# Patient Record
Sex: Female | Born: 1990 | Race: Black or African American | Hispanic: No | Marital: Single | Smoking: Never smoker
Health system: Southern US, Community
[De-identification: ages and names within clinical notes are randomized; demographics above are authoritative.]

## PROBLEM LIST (undated history)

## (undated) ENCOUNTER — Emergency Department (HOSPITAL_COMMUNITY): Admission: EM | Payer: Self-pay | Source: Home / Self Care

## (undated) DIAGNOSIS — N946 Dysmenorrhea, unspecified: Secondary | ICD-10-CM

## (undated) DIAGNOSIS — R7989 Other specified abnormal findings of blood chemistry: Secondary | ICD-10-CM

## (undated) DIAGNOSIS — G43009 Migraine without aura, not intractable, without status migrainosus: Secondary | ICD-10-CM

## (undated) HISTORY — PX: WISDOM TOOTH EXTRACTION: SHX21

## (undated) HISTORY — DX: Other specified abnormal findings of blood chemistry: R79.89

## (undated) HISTORY — DX: Dysmenorrhea, unspecified: N94.6

## (undated) HISTORY — PX: CYST REMOVAL HAND: SHX6279

## (undated) HISTORY — DX: Migraine without aura, not intractable, without status migrainosus: G43.009

---
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
## 2017-07-13 DIAGNOSIS — M67431 Ganglion, right wrist: Secondary | ICD-10-CM | POA: Diagnosis not present

## 2017-07-13 DIAGNOSIS — Z01812 Encounter for preprocedural laboratory examination: Secondary | ICD-10-CM | POA: Diagnosis not present

## 2017-07-20 DIAGNOSIS — H52223 Regular astigmatism, bilateral: Secondary | ICD-10-CM | POA: Diagnosis not present

## 2017-07-27 DIAGNOSIS — Z113 Encounter for screening for infections with a predominantly sexual mode of transmission: Secondary | ICD-10-CM | POA: Diagnosis not present

## 2017-07-27 DIAGNOSIS — R100 Acute abdomen: Secondary | ICD-10-CM | POA: Diagnosis not present

## 2017-07-31 DIAGNOSIS — M67431 Ganglion, right wrist: Secondary | ICD-10-CM | POA: Diagnosis not present

## 2017-09-10 DIAGNOSIS — N760 Acute vaginitis: Secondary | ICD-10-CM | POA: Diagnosis not present

## 2017-09-10 DIAGNOSIS — Z113 Encounter for screening for infections with a predominantly sexual mode of transmission: Secondary | ICD-10-CM | POA: Diagnosis not present

## 2017-09-19 DIAGNOSIS — Z01419 Encounter for gynecological examination (general) (routine) without abnormal findings: Secondary | ICD-10-CM | POA: Diagnosis not present

## 2017-09-19 DIAGNOSIS — Z30019 Encounter for initial prescription of contraceptives, unspecified: Secondary | ICD-10-CM | POA: Diagnosis not present

## 2017-09-19 DIAGNOSIS — Z113 Encounter for screening for infections with a predominantly sexual mode of transmission: Secondary | ICD-10-CM | POA: Diagnosis not present

## 2017-12-28 DIAGNOSIS — R112 Nausea with vomiting, unspecified: Secondary | ICD-10-CM | POA: Diagnosis not present

## 2017-12-28 DIAGNOSIS — M545 Low back pain: Secondary | ICD-10-CM | POA: Diagnosis not present

## 2017-12-28 DIAGNOSIS — D219 Benign neoplasm of connective and other soft tissue, unspecified: Secondary | ICD-10-CM | POA: Diagnosis not present

## 2017-12-28 DIAGNOSIS — R1032 Left lower quadrant pain: Secondary | ICD-10-CM | POA: Diagnosis not present

## 2017-12-28 DIAGNOSIS — N83202 Unspecified ovarian cyst, left side: Secondary | ICD-10-CM | POA: Diagnosis not present

## 2017-12-28 DIAGNOSIS — R42 Dizziness and giddiness: Secondary | ICD-10-CM | POA: Diagnosis not present

## 2017-12-28 DIAGNOSIS — D259 Leiomyoma of uterus, unspecified: Secondary | ICD-10-CM | POA: Diagnosis not present

## 2018-01-01 DIAGNOSIS — R102 Pelvic and perineal pain: Secondary | ICD-10-CM | POA: Diagnosis not present

## 2018-01-01 DIAGNOSIS — N940 Mittelschmerz: Secondary | ICD-10-CM | POA: Diagnosis not present

## 2018-04-02 DIAGNOSIS — B379 Candidiasis, unspecified: Secondary | ICD-10-CM | POA: Diagnosis not present

## 2018-04-02 DIAGNOSIS — L739 Follicular disorder, unspecified: Secondary | ICD-10-CM | POA: Diagnosis not present

## 2018-04-02 DIAGNOSIS — N898 Other specified noninflammatory disorders of vagina: Secondary | ICD-10-CM | POA: Diagnosis not present

## 2018-04-02 DIAGNOSIS — Z6829 Body mass index (BMI) 29.0-29.9, adult: Secondary | ICD-10-CM | POA: Diagnosis not present

## 2018-09-27 ENCOUNTER — Encounter (HOSPITAL_BASED_OUTPATIENT_CLINIC_OR_DEPARTMENT_OTHER): Payer: Self-pay | Admitting: Adult Health

## 2018-09-27 ENCOUNTER — Emergency Department (HOSPITAL_BASED_OUTPATIENT_CLINIC_OR_DEPARTMENT_OTHER)
Admission: EM | Admit: 2018-09-27 | Discharge: 2018-09-28 | Disposition: A | Discharge status: Home / Self Care | Payer: BLUE CROSS/BLUE SHIELD | Attending: Emergency Medicine | Admitting: Emergency Medicine

## 2018-09-27 ENCOUNTER — Other Ambulatory Visit: Payer: Self-pay

## 2018-09-27 DIAGNOSIS — T39395A Adverse effect of other nonsteroidal anti-inflammatory drugs [NSAID], initial encounter: Secondary | ICD-10-CM | POA: Diagnosis not present

## 2018-09-27 DIAGNOSIS — T783XXA Angioneurotic edema, initial encounter: Secondary | ICD-10-CM | POA: Insufficient documentation

## 2018-09-27 DIAGNOSIS — T7840XA Allergy, unspecified, initial encounter: Secondary | ICD-10-CM

## 2018-09-27 DIAGNOSIS — R220 Localized swelling, mass and lump, head: Secondary | ICD-10-CM | POA: Diagnosis present

## 2018-09-27 MED ORDER — FAMOTIDINE IN NACL 20-0.9 MG/50ML-% IV SOLN
20.0000 mg | Freq: Once | INTRAVENOUS | Status: AC
Start: 2018-09-27 — End: 2018-09-27
  Administered 2018-09-27: 20 mg via INTRAVENOUS
  Filled 2018-09-27: qty 50

## 2018-09-27 MED ORDER — PREDNISONE 10 MG (21) PO TBPK: ORAL_TABLET | Freq: Every day | ORAL | 0 refills | Status: DC

## 2018-09-27 MED ORDER — FAMOTIDINE 20 MG PO TABS: 20.0000 mg | ORAL_TABLET | Freq: Two times a day (BID) | ORAL | 0 refills | Status: DC

## 2018-09-27 MED ORDER — EPINEPHRINE 0.3 MG/0.3ML IJ SOAJ
0.3000 mg | Freq: Once | INTRAMUSCULAR | Status: AC
  Administered 2018-09-27: 0.3 mg via INTRAMUSCULAR
  Filled 2018-09-27: qty 0.3

## 2018-09-27 MED ORDER — METHYLPREDNISOLONE SODIUM SUCC 125 MG IJ SOLR
125.0000 mg | Freq: Once | INTRAMUSCULAR | Status: AC
  Administered 2018-09-27: 125 mg via INTRAVENOUS
  Filled 2018-09-27: qty 2

## 2018-09-27 MED ORDER — EPINEPHRINE 0.3 MG/0.3ML IJ SOAJ: 0.3000 mg | Freq: Once | INTRAMUSCULAR | 0 refills | Status: AC

## 2018-09-27 NOTE — ED Triage Notes (Addendum)
Presents with facial swelling, lip swelling, urticaria and wheezing feeling after taking Ibuprofen. PT has same allergic response to ASA.

## 2018-09-27 NOTE — ED Notes (Signed)
Hives on abd have decreased in size and pt reports itching sensation has subsided. Pt eye lids upper and lower remain swollen. Pt is able to open and close her eyes. Pt denies dysnea, sob, or wheezing at this time.

## 2018-09-27 NOTE — Discharge Instructions (Addendum)
Take steroids beginning tomorrow.  You can take Pepcid and Benadryl as needed for itching. If you need to use your EpiPen, call 911 immediately afterwards.

## 2018-09-27 NOTE — ED Provider Notes (Signed)
[facility] EMERGENCY DEPARTMENT Provider Note   CSN: [id number] Arrival date & time: 09/27/18  2200     History   Chief Complaint Chief Complaint  Patient presents with  . Allergic Reaction    HPI Lynn Brooks is a 27 y.o. female who presents to ED for allergic reaction.  States that she took a dose of ibuprofen about 30 minutes prior to arrival.  She began having swelling around her eyes, feeling like her tongue is itchy as well as diffuse hives.  She said history of similar symptoms when she took aspirin in the past.  However, she does state that she is taking ibuprofen in the past with none of the symptoms.  She cannot recall any other inciting factor that may have triggered the symptoms.  She has not use an EpiPen in the past.  Denies any chest tightness but does not endorse wheezing.  Denies sensation of throat closing up, lip swelling or tongue swelling.  No known cardiac history.  HPI  History reviewed. No pertinent past medical history.  There are no active problems to display for this patient.      OB History   None      Home Medications    Prior to Admission medications   Medication Sig Start Date End Date Taking? Authorizing Provider  EPINEPHrine 0.3 mg/0.3 mL IJ SOAJ injection Inject 0.3 mLs (0.3 mg total) into the muscle once for 1 dose. 09/27/18 09/27/18  Trenton Verne, PA-C  famotidine (PEPCID) 20 MG tablet Take 1 tablet (20 mg total) by mouth 2 (two) times daily. 09/27/18   Ertha Nabor, PA-C  predniSONE (STERAPRED UNI-PAK 21 TAB) 10 MG (21) TBPK tablet Take by mouth daily. Take 6 tabs by mouth daily  for 2 days, then 5 tabs for 2 days, then 4 tabs for 2 days, then 3 tabs for 2 days, 2 tabs for 2 days, then 1 tab by mouth daily for 2 days 09/27/18   Dietrich Pates, PA-C    Family History History reviewed. No pertinent family history.  Social History Social History   Tobacco Use  . Smoking status: Not on file  Substance Use Topics  . Alcohol use:  Not on file  . Drug use: Not on file     Allergies   Aspirin   Review of Systems Review of Systems  Constitutional: Negative for appetite change, chills and fever.  HENT: Positive for facial swelling. Negative for ear pain, rhinorrhea, sneezing and sore throat.   Eyes: Negative for photophobia and visual disturbance.  Respiratory: Negative for cough, chest tightness, shortness of breath and wheezing.   Cardiovascular: Negative for chest pain and palpitations.  Gastrointestinal: Negative for abdominal pain, blood in stool, constipation, diarrhea, nausea and vomiting.  Genitourinary: Negative for dysuria, hematuria and urgency.  Musculoskeletal: Negative for myalgias.  Skin: Positive for rash.  Neurological: Negative for dizziness, weakness and light-headedness.     Physical Exam Updated Vital Signs BP 117/69 (BP Location: Right Arm)   Pulse 70   Temp 98.1 F (36.7 C) (Oral)   Resp 18   Ht 5' (1.524 m)   Wt 70.3 kg   LMP 09/20/2018 (Exact Date)   SpO2 94%   BMI 30.27 kg/m   Physical Exam  Constitutional: She appears well-developed and well-nourished. No distress.  Normal work of breathing noted.  No lip swelling or tongue swelling noted.  Normal voice.  HENT:  Head: Normocephalic and atraumatic.  Nose: Nose normal.  Eyes: Conjunctivae and  EOM are normal. Right eye exhibits no discharge. Left eye exhibits no discharge. No scleral icterus.  Bilateral periorbital edema.  EOMs intact.  Neck: Normal range of motion. Neck supple.  Cardiovascular: Normal rate, regular rhythm, normal heart sounds and intact distal pulses. Exam reveals no gallop and no friction rub.  No murmur heard. Pulmonary/Chest: Effort normal and breath sounds normal. No respiratory distress.  Abdominal: Soft. Bowel sounds are normal. She exhibits no distension. There is no tenderness. There is no guarding.  Musculoskeletal: Normal range of motion. She exhibits no edema.  Neurological: She is alert. She  exhibits normal muscle tone. Coordination normal.  Skin: Skin is warm and dry. Rash noted.  Diffuse urticarial rash to torso.  Psychiatric: She has a normal mood and affect.  Nursing note and vitals reviewed.    ED Treatments / Results  Labs (all labs ordered are listed, but only abnormal results are displayed) Labs Reviewed - No data to display  EKG None  Radiology No results found.  Procedures Procedures (including critical care time)  CRITICAL CARE Performed by: Dietrich Pates   Total critical care time: 45 minutes  Critical care time was exclusive of separately billable procedures and treating other patients.  Critical care was necessary to treat or prevent imminent or life-threatening deterioration.  Critical care was time spent personally by me on the following activities: development of treatment plan with patient and/or surrogate as well as nursing, discussions with consultants, evaluation of patient's response to treatment, examination of patient, obtaining history from patient or surrogate, ordering and performing treatments and interventions, ordering and review of laboratory studies, ordering and review of radiographic studies, pulse oximetry and re-evaluation of patient's condition.   Medications Ordered in ED Medications  methylPREDNISolone sodium succinate (SOLU-MEDROL) 125 mg/2 mL injection 125 mg (125 mg Intravenous Given 09/27/18 2216)  famotidine (PEPCID) IVPB 20 mg premix (0 mg Intravenous Stopped 09/27/18 2252)  EPINEPHrine (EPI-PEN) injection 0.3 mg (0.3 mg Intramuscular Given 09/27/18 2216)     Initial Impression / Assessment and Plan / ED Course  I have reviewed the triage vital signs and the nursing notes.  Pertinent labs & imaging results that were available during my care of the patient were reviewed by me and considered in my medical decision making (see chart for details).     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  She took ibuprofen prior to arrival.  She is taking ibuprofen in the past with no symptoms.  However, she reports similar symptoms when taking aspirin.  She is taking it for a headache.  She reports swelling around bilateral eyes, sensation of itchy tongue.  Denies any sensation of throat closing or chest tightness.  She took 2 Benadryl prior to arrival.  On exam there is bilateral periorbital swelling noted.  No signs of respiratory distress, airway compromise or voice changes noted.  She was given a dose of epinephrine, Solu-Medrol and Pepcid.  She will need to be observed for appropriate amount of time after medications.   Portions of this note were generated with Scientist, clinical (histocompatibility and immunogenetics). Dictation errors may occur despite best attempts at proofreading.  Final Clinical Impressions(s) / ED Diagnoses   Final diagnoses:  Angioedema, initial encounter  Allergic reaction to drug, initial encounter    ED Discharge Orders         Ordered    predniSONE (STERAPRED UNI-PAK 21 TAB) 10 MG (21) TBPK tablet  Daily  09/27/18 2305    EPINEPHrine 0.3 mg/0.3 mL IJ SOAJ injection   Once     09/27/18 2305    famotidine (PEPCID) 20 MG tablet  2 times daily     09/27/18 2305           Dietrich Pates, PA-C 09/27/18 2308    Benjiman Core, MD 09/27/18 2328

## 2018-09-28 MED ORDER — HYDROCODONE-ACETAMINOPHEN 5-325 MG PO TABS
1.0000 | ORAL_TABLET | Freq: Once | ORAL | Status: AC
  Administered 2018-09-28: 1 via ORAL
  Filled 2018-09-28: qty 1

## 2018-11-14 DIAGNOSIS — L501 Idiopathic urticaria: Secondary | ICD-10-CM | POA: Diagnosis not present

## 2018-11-26 ENCOUNTER — Other Ambulatory Visit: Payer: Self-pay

## 2018-11-26 ENCOUNTER — Encounter: Payer: Self-pay | Admitting: Obstetrics and Gynecology

## 2018-11-26 ENCOUNTER — Ambulatory Visit: Payer: BLUE CROSS/BLUE SHIELD | Admitting: Obstetrics and Gynecology

## 2018-11-26 VITALS — BP 118/86 | HR 71 | Wt 155.8 lb

## 2018-11-26 DIAGNOSIS — B9689 Other specified bacterial agents as the cause of diseases classified elsewhere: Secondary | ICD-10-CM | POA: Diagnosis not present

## 2018-11-26 DIAGNOSIS — N760 Acute vaginitis: Secondary | ICD-10-CM

## 2018-11-26 MED ORDER — METRONIDAZOLE 500 MG PO TABS: 500.0000 mg | ORAL_TABLET | Freq: Two times a day (BID) | ORAL | 0 refills | Status: DC

## 2018-11-26 NOTE — Progress Notes (Signed)
28 y.o. G0P0000 Single Black or African American Not Hispanic or Latino female here for vaginal discharge and odor. The d/c started a month ago, watery, white, frothy with an odor.  Occasionally has itching. No urinary c/o. Sexually active, same partner x 8 years.    Period Cycle (Days): 28 Period Duration (Days): 5-7 days Period Pattern: Regular Menstrual Flow: Heavy, Light Menstrual Control: Tampon Menstrual Control Change Freq (Hours): changes tampon every 2 hours Dysmenorrhea: (!) Severe Dysmenorrhea Symptoms: Cramping  Patient's last menstrual period was 11/15/2018 (exact date).          Sexually active: Yes.    The current method of family planning is none.    Exercising: Yes.    cardio, weights Smoker:  no  Health Maintenance: Pap:  10/2018 WNL per patient. History of abnormal Pap:  no TDaP:  Unsure Gardasil: completed all 3 per patient   reports that she has never smoked. She has never used smokeless tobacco. She reports current alcohol use. She reports that she does not use drugs.  Past Medical History:  Diagnosis Date  . Dysmenorrhea     Past Surgical History:  Procedure Laterality Date  . CYST REMOVAL HAND    . WISDOM TOOTH EXTRACTION      Current Outpatient Medications  Medication Sig Dispense Refill  . cetirizine (ZYRTEC) 10 MG tablet Take 2 tablets by mouth daily.     No current facility-administered medications for this visit.     Family History  Problem Relation Age of Onset  . Breast cancer Paternal Aunt     Review of Systems  Constitutional: Negative.   HENT: Negative.   Eyes: Negative.   Respiratory: Negative.   Cardiovascular: Negative.   Gastrointestinal: Negative.   Endocrine: Negative.   Genitourinary: Positive for vaginal discharge.       Vaginal odor  Musculoskeletal: Negative.   Skin: Negative.   Allergic/Immunologic: Negative.   Neurological: Negative.   Hematological: Negative.   Psychiatric/Behavioral: Negative.     Exam:    BP 118/86 (BP Location: Right Arm, Patient Position: Sitting, Cuff Size: Normal)   Pulse 71   Wt 155 lb 12.8 oz (70.7 kg)   LMP 11/15/2018 (Exact Date)   BMI 30.43 kg/m   Weight change: @WEIGHTCHANGE @ Height:      Ht Readings from Last 3 Encounters:  09/27/18 5' (1.524 m)    General appearance: alert, cooperative and appears stated age   Pelvic: External genitalia:  no lesions              Urethra:  normal appearing urethra with no masses, tenderness or lesions              Bartholins and Skenes: normal                 Vagina: normal appearing vagina with slight erythema, some increase in watery/creamy white vaginal d/c, frothy, no lesions              Cervix: no lesions                Chaperone was present for exam.  Wet prep: ++ clue, no trich, rare wbc KOH: no yeast PH: 5   A:  Bacterial vaginitis  P:   Treat with flagyl, no ETOH  Discussed condoms to try and prevent recurrence

## 2018-11-26 NOTE — Patient Instructions (Signed)
Vaginitis  Vaginitis is a condition in which the vaginal tissue swells and becomes red (inflamed). This condition is most often caused by a change in the normal balance of bacteria and yeast that live in the vagina. This change causes an overgrowth of certain bacteria or yeast, which causes the inflammation. There are different types of vaginitis, but the most common types are:   Bacterial vaginosis.   Yeast infection (candidiasis).   Trichomoniasis vaginitis. This is a sexually transmitted disease (STD).   Viral vaginitis.   Atrophic vaginitis.   Allergic vaginitis.  What are the causes?  The cause of this condition depends on the type of vaginitis. It can be caused by:   Bacteria (bacterial vaginosis).   Yeast, which is a fungus (yeast infection).   A parasite (trichomoniasis vaginitis).   A virus (viral vaginitis).   Low hormone levels (atrophic vaginitis). Low hormone levels can occur during pregnancy, breastfeeding, or after menopause.   Irritants, such as bubble baths, scented tampons, and feminine sprays (allergic vaginitis).  Other factors can change the normal balance of the yeast and bacteria that live in the vagina. These include:   Antibiotic medicines.   Poor hygiene.   Diaphragms, vaginal sponges, spermicides, birth control pills, and intrauterine devices (IUD).   Sex.   Infection.   Uncontrolled diabetes.   A weakened defense (immune) system.  What increases the risk?  This condition is more likely to develop in women who:   Smoke.   Use vaginal douches, scented tampons, or scented sanitary pads.   Wear tight-fitting pants.   Wear thong underwear.   Use oral birth control pills or an IUD.   Have sex without a condom.   Have multiple sex partners.   Have an STD.   Frequently use the spermicide nonoxynol-9.   Eat lots of foods high in sugar.   Have uncontrolled diabetes.   Have low estrogen levels.   Have a weakened immune system from an immune disorder or medical  treatment.   Are pregnant or breastfeeding.  What are the signs or symptoms?  Symptoms vary depending on the cause of the vaginitis. Common symptoms include:   Abnormal vaginal discharge.  ? The discharge is white, gray, or yellow with bacterial vaginosis.  ? The discharge is thick, white, and cheesy with a yeast infection.  ? The discharge is frothy and yellow or greenish with trichomoniasis.   A bad vaginal smell. The smell is fishy with bacterial vaginosis.   Vaginal itching, pain, or swelling.   Sex that is painful.   Pain or burning when urinating.  Sometimes there are no symptoms.  How is this diagnosed?  This condition is diagnosed based on your symptoms and medical history. A physical exam, including a pelvic exam, will also be done. You may also have other tests, including:   Tests to determine the pH level (acidity or alkalinity) of your vagina.   A whiff test, to assess the odor that results when a sample of your vaginal discharge is mixed with a potassium hydroxide solution.   Tests of vaginal fluid. A sample will be examined under a microscope.  How is this treated?  Treatment varies depending on the type of vaginitis you have. Your treatment may include:   Antibiotic creams or pills to treat bacterial vaginosis and trichomoniasis.   Antifungal medicines, such as vaginal creams or suppositories, to treat a yeast infection.   Medicine to ease discomfort if you have viral vaginitis. Your sexual partner   should also be treated.   Estrogen delivered in a cream, pill, suppository, or vaginal ring to treat atrophic vaginitis. If vaginal dryness occurs, lubricants and moisturizing creams may help. You may need to avoid scented soaps, sprays, or douches.   Stopping use of a product that is causing allergic vaginitis. Then using a vaginal cream to treat the symptoms.  Follow these instructions at home:  Lifestyle   Keep your genital area clean and dry. Avoid soap, and only rinse the area with  water.   Do not douche or use tampons until your health care provider says it is okay to do so. Use sanitary pads, if needed.   Do not have sex until your health care provider approves. When you can return to sex, practice safe sex and use condoms.   Wipe from front to back. This avoids the spread of bacteria from the rectum to the vagina.  General instructions   Take over-the-counter and prescription medicines only as told by your health care provider.   If you were prescribed an antibiotic medicine, take or use it as told by your health care provider. Do not stop taking or using the antibiotic even if you start to feel better.   Keep all follow-up visits as told by your health care provider. This is important.  How is this prevented?   Use mild, non-scented products. Do not use things that can irritate the vagina, such as fabric softeners. Avoid the following products if they are scented:  ? Feminine sprays.  ? Detergents.  ? Tampons.  ? Feminine hygiene products.  ? Soaps or bubble baths.   Let air reach your genital area.  ? Wear cotton underwear to reduce moisture buildup.  ? Avoid wearing underwear while you sleep.  ? Avoid wearing tight pants and underwear or nylons without a cotton panel.  ? Avoid wearing thong underwear.   Take off any wet clothing, such as bathing suits, as soon as possible.   Practice safe sex and use condoms.  Contact a health care provider if:   You have abdominal pain.   You have a fever.   You have symptoms that last for more than 2-3 days.  Get help right away if:   You have a fever and your symptoms suddenly get worse.  Summary   Vaginitis is a condition in which the vaginal tissue becomes inflamed.This condition is most often caused by a change in the normal balance of bacteria and yeast that live in the vagina.   Treatment varies depending on the type of vaginitis you have.   Do not douche, use tampons , or have sex until your health care provider approves. When  you can return to sex, practice safe sex and use condoms.  This information is not intended to replace advice given to you by your health care provider. Make sure you discuss any questions you have with your health care provider.  Document Released: 09/03/2007 Document Revised: 12/12/2016 Document Reviewed: 12/12/2016  Elsevier Interactive Patient Education  2019 [employer]

## 2018-12-02 ENCOUNTER — Telehealth: Payer: Self-pay | Admitting: Obstetrics and Gynecology

## 2018-12-02 NOTE — Telephone Encounter (Signed)
I apologize. Patient was advised to abstain for 7 days until she is completely finished with the entire course of Flagyl.

## 2018-12-02 NOTE — Telephone Encounter (Signed)
She needs to take the Flagyl for 7 days.  She has 14 tablets.  Abstain until all 14 tablets are taken.

## 2018-12-02 NOTE — Telephone Encounter (Signed)
Patient wanting to know how long should she wait after taking medication to have intercourse.

## 2018-12-02 NOTE — Telephone Encounter (Signed)
Spoke with patient. Patient is taking oral Flagyl 500 mg po BID x 5 days for BV. Asking how long she needs to wait before having sex. Reccommended that patient abstain from intercourse until after completion of medication and to use condoms to try to prevent recurrence. Patient verbalizes understanding.   Routing to covering provider and will close encounter.

## 2018-12-20 ENCOUNTER — Telehealth: Payer: Self-pay | Admitting: Obstetrics and Gynecology

## 2018-12-20 ENCOUNTER — Ambulatory Visit (INDEPENDENT_AMBULATORY_CARE_PROVIDER_SITE_OTHER): Payer: BLUE CROSS/BLUE SHIELD | Admitting: Obstetrics and Gynecology

## 2018-12-20 ENCOUNTER — Encounter: Payer: Self-pay | Admitting: Obstetrics and Gynecology

## 2018-12-20 VITALS — BP 102/60 | HR 80 | Resp 16 | Ht 60.0 in | Wt 153.0 lb

## 2018-12-20 DIAGNOSIS — N760 Acute vaginitis: Secondary | ICD-10-CM

## 2018-12-20 DIAGNOSIS — Z3009 Encounter for other general counseling and advice on contraception: Secondary | ICD-10-CM | POA: Diagnosis not present

## 2018-12-20 DIAGNOSIS — B372 Candidiasis of skin and nail: Secondary | ICD-10-CM | POA: Diagnosis not present

## 2018-12-20 DIAGNOSIS — N644 Mastodynia: Secondary | ICD-10-CM

## 2018-12-20 MED ORDER — FLUCONAZOLE 150 MG PO TABS: ORAL_TABLET | ORAL | 0 refills | Status: DC

## 2018-12-20 MED ORDER — NORETHIN ACE-ETH ESTRAD-FE 1-20 MG-MCG PO TABS: 1.0000 | ORAL_TABLET | Freq: Every day | ORAL | 0 refills | Status: DC

## 2018-12-20 MED ORDER — NYSTATIN 100000 UNIT/GM EX CREA: 1.0000 "application " | TOPICAL_CREAM | Freq: Two times a day (BID) | CUTANEOUS | 0 refills | Status: DC

## 2018-12-20 NOTE — Progress Notes (Signed)
GYNECOLOGY  VISIT   HPI: 28 y.o.   Single Black or African American Not Hispanic or Latino  female   G0P0000 with Patient's last menstrual period was 12/13/2018.   here for   Vaginitis symptoms; itching that started yesterday and white, clumpy discharge starting today. She notices these symptoms every month after her cycle for 2-3 years.  Patient also complains of having an itchy rash under both breasts, intermittently for the last month. She is interested in contraception, didn't like nexplanon or depo-provera. She c/o long term intermittent, aching pain around her right areolar region, worse prior to her cycle.   GYNECOLOGIC HISTORY: Patient's last menstrual period was 12/13/2018. Contraception:Condoms all the time per patient Menopausal hormone therapy: none        OB History    Gravida  0   Para  0   Term  0   Preterm  0   AB  0   Living  0     SAB  0   TAB  0   Ectopic  0   Multiple  0   Live Births  0              There are no active problems to display for this patient.   Past Medical History:  Diagnosis Date  . Dysmenorrhea     Past Surgical History:  Procedure Laterality Date  . CYST REMOVAL HAND    . WISDOM TOOTH EXTRACTION      Current Outpatient Medications  Medication Sig Dispense Refill  . cetirizine (ZYRTEC) 10 MG tablet Take 2 tablets by mouth daily.    . Probiotic Product (PROBIOTIC-10 PO) Take by mouth daily.     No current facility-administered medications for this visit.      ALLERGIES: Aspirin and Ibuprofen  Family History  Problem Relation Age of Onset  . Breast cancer Paternal Aunt     Social History   Socioeconomic History  . Marital status: Single    Spouse name: Not on file  . Number of children: Not on file  . Years of education: Not on file  . Highest education level: Not on file  Occupational History  . Not on file  Social Needs  . Financial resource strain: Not on file  . Food insecurity:    Worry: Not  on file    Inability: Not on file  . Transportation needs:    Medical: Not on file    Non-medical: Not on file  Tobacco Use  . Smoking status: Never Smoker  . Smokeless tobacco: Never Used  Substance and Sexual Activity  . Alcohol use: Yes    Comment: occassionally  . Drug use: Never  . Sexual activity: Yes    Birth control/protection: None  Lifestyle  . Physical activity:    Days per week: Not on file    Minutes per session: Not on file  . Stress: Not on file  Relationships  . Social connections:    Talks on phone: Not on file    Gets together: Not on file    Attends religious service: Not on file    Active member of club or organization: Not on file    Attends meetings of clubs or organizations: Not on file    Relationship status: Not on file  . Intimate partner violence:    Fear of current or ex partner: Not on file    Emotionally abused: Not on file    Physically abused: Not on file  Forced sexual activity: Not on file  Other Topics Concern  . Not on file  Social History Narrative  . Not on file    Review of Systems  Constitutional:       Rash under both breasts  HENT: Negative.   Eyes: Negative.   Respiratory: Negative.   Cardiovascular: Negative.   Gastrointestinal: Negative.   Genitourinary:       Abnormal discharge Vulvar itching  Musculoskeletal: Negative.   Skin: Negative.   Neurological: Negative.   Endo/Heme/Allergies: Negative.   Psychiatric/Behavioral: Negative.     PHYSICAL EXAMINATION:    BP 102/60 (BP Location: Right Arm, Patient Position: Sitting, Cuff Size: Large)   Pulse 80   Resp 16   Ht 5' (1.524 m)   Wt 153 lb (69.4 kg)   LMP 12/13/2018   BMI 29.88 kg/m     General appearance: alert, cooperative and appears stated age Breasts: normal appearance, no masses or tenderness No axillary adenopathy Skin: under bilateral breasts is an increase in pigmentation  Pelvic: External genitalia:  no lesions              Urethra:  normal  appearing urethra with no masses, tenderness or lesions              Bartholins and Skenes: normal                 Vagina: normal appearing vagina with slight erythema and an increase in thick, clumpy white vaginal discharge              Cervix: no lesions               Chaperone was present for exam.  Wet prep: no clue, no trich, ++ wbc KOH: + yeast PH: 4   ASSESSMENT Yeast Vaginitis (she reports recurrent monthly symptoms) Rash under breast, suspect candida intertrigo Contraception, interested in OCP's, no contraindications Right breast intermittently painful,  long term, worse prior to her cycle. Normal exam    PLAN Diflucan for yeast, will give 3 doses given long term c/o Treat intertrigo with nystatin Start OCP's, reviewed risks Discussed breast pain Return for a an annual exam in 3 months, if her breast pain persists will set up imaging   An After Visit Summary was printed and given to the patient.

## 2018-12-20 NOTE — Telephone Encounter (Signed)
Office visit with Dr. Oscar La today scheduled for evaluation of vaginal discharge.

## 2018-12-20 NOTE — Patient Instructions (Addendum)
To try and decrease your breast pain, you should have a well fitting supportive bra, cut back on caffeine, and use ice or heat as needed. Some women find relief with the supplement evening primrose oil.   Oral Contraception Information Oral contraceptive pills (OCPs) are medicines taken to prevent pregnancy. OCPs are taken by mouth, and they work by:  Preventing the ovaries from releasing eggs.  Thickening mucus in the lower part of the uterus (cervix), which prevents sperm from entering the uterus.  Thinning the lining of the uterus (endometrium), which prevents a fertilized egg from attaching to the endometrium. OCPs are highly effective when taken exactly as prescribed. However, OCPs do not prevent STIs (sexually transmitted infections). Safe sex practices, such as using condoms while on an OCP, can help prevent STIs. Before starting OCPs Before you start taking OCPs, you may have a physical exam, blood test, and Pap test. However, you are not required to have a pelvic exam in order to be prescribed OCPs. Your health care provider will make sure you are a good candidate for oral contraception. OCPs are not a good option for certain women, including women who smoke and are older than 35 years, and women with a medical history of high blood pressure, deep vein thrombosis, pulmonary embolism, stroke, cardiovascular disease, or peripheral vascular disease. Discuss with your health care provider the possible side effects of the OCP you may be prescribed. When you start an OCP, be aware that it can take 2-3 months for your body to adjust to changes in hormone levels. Follow instructions from your health care provider about how to start taking your first cycle of OCPs. Depending on when you start the pill, you may need to use a backup form of birth control, such as condoms, during the first week. Make sure you know what steps to take if you ever forget to take the pill. Types of oral contraception  The  most common types of birth control pills contain the hormones estrogen and progestin (synthetic progesterone) or progestin only. The combination pill This type of pill contains estrogen and progestin hormones. Combination pills often come in packs of 21, 28, or 91 pills. For each pack, the last 7 pills may not contain hormones, which means you may stop taking the pills for 7 days. Menstrual bleeding occurs during the week that you do not take the pills or that you take the pills with no hormones in them. The minipill This type of pill contains the progestin hormone only. It comes in packs of 28 pills. All 28 pills contain the hormone. You take the pill every day. It is very important to take the pill at the same time each day. Advantages of oral contraceptive pills  Provides reliable and continuous contraception if taken as instructed.  May treat or decrease symptoms of: ? Menstrual period cramps. ? Irregular menstrual cycle or bleeding. ? Heavy menstrual flow. ? Abnormal uterine bleeding. ? Acne, depending on the type of pill. ? Polycystic ovarian syndrome. ? Endometriosis. ? Iron deficiency anemia. ? Premenstrual symptoms, including premenstrual dysphoric disorder.  May reduce the risk of endometrial and ovarian cancer.  Can be used as emergency contraception.  Prevents mislocated (ectopic) pregnancies and infections of the fallopian tubes. Things that can make oral contraceptive pills less effective OCPs can be less effective if:  You forget to take the pill at the same time every day. This is especially important when taking the minipill.  You have a stomach or intestinal  disease that reduces your body's ability to absorb the pill.  You take OCPs with other medicines that make OCPs less effective, such as antibiotics, certain HIV medicines, and some seizure medicines.  You take expired OCPs.  You forget to restart the pill on day 7, if using the packs of 21 pills. Risks  associated with oral contraceptive pills Oral contraceptive pills can sometimes cause side effects, such as:  Headache.  Depression.  Trouble sleeping.  Nausea and vomiting.  Breast tenderness.  Irregular bleeding or spotting during the first several months.  Bloating or fluid retention.  Increase in blood pressure. Combination pills are also associated with a small increase in the risk of:  Blood clots.  Heart attack.  Stroke. Summary  Oral contraceptive pills are medicines taken by mouth to prevent pregnancy. They are highly effective when taken exactly as prescribed.  The most common types of birth control pills contain the hormones estrogen and progestin (synthetic progesterone) or progestin only.  Before you start taking the pill, you may have a physical exam, blood test, and Pap test. Your health care provider will make sure you are a good candidate for oral contraception.  The combination pill may come in a 21-day pack, a 28-day pack, or a 91-day pack. The minipill contains the progesterone hormone only and comes in packs of 28 pills.  Oral contraceptive pills can sometimes cause side effects, such as headache, nausea, breast tenderness, or irregular bleeding. This information is not intended to replace advice given to you by your health care provider. Make sure you discuss any questions you have with your health care provider. Document Released: 01/27/2003 Document Revised: 01/30/2017 Document Reviewed: 01/30/2017 Elsevier Interactive Patient Education  2019 [employer]  Vaginal Yeast infection, Adult  Vaginal yeast infection is a condition that causes vaginal discharge as well as soreness, swelling, and redness (inflammation) of the vagina. This is a common condition. Some women get this infection frequently. What are the causes? This condition is caused by a change in the normal balance of the yeast (candida) and bacteria that live in the vagina. This change  causes an overgrowth of yeast, which causes the inflammation. What increases the risk? The condition is more likely to develop in women who:  Take antibiotic medicines.  Have diabetes.  Take birth control pills.  Are pregnant.  Douche often.  Have a weak body defense system (immune system).  Have been taking steroid medicines for a long time.  Frequently wear tight clothing. What are the signs or symptoms? Symptoms of this condition include:  White, thick, creamy vaginal discharge.  Swelling, itching, redness, and irritation of the vagina. The lips of the vagina (vulva) may be affected as well.  Pain or a burning feeling while urinating.  Pain during sex. How is this diagnosed? This condition is diagnosed based on:  Your medical history.  A physical exam.  A pelvic exam. Your health care provider will examine a sample of your vaginal discharge under a microscope. Your health care provider may send this sample for testing to confirm the diagnosis. How is this treated? This condition is treated with medicine. Medicines may be over-the-counter or prescription. You may be told to use one or more of the following:  Medicine that is taken by mouth (orally).  Medicine that is applied as a cream (topically).  Medicine that is inserted directly into the vagina (suppository). Follow these instructions at home:  Lifestyle  Do not have sex until your health care provider  approves. Tell your sex partner that you have a yeast infection. That person should go to his or her health care provider and ask if they should also be treated.  Do not wear tight clothes, such as pantyhose or tight pants.  Wear breathable cotton underwear. General instructions  Take or apply over-the-counter and prescription medicines only as told by your health care provider.  Eat more yogurt. This may help to keep your yeast infection from returning.  Do not use tampons until your health care  provider approves.  Try taking a sitz bath to help with discomfort. This is a warm water bath that is taken while you are sitting down. The water should only come up to your hips and should cover your buttocks. Do this 3-4 times per day or as told by your health care provider.  Do not douche.  If you have diabetes, keep your blood sugar levels under control.  Keep all follow-up visits as told by your health care provider. This is important. Contact a health care provider if:  You have a fever.  Your symptoms go away and then return.  Your symptoms do not get better with treatment.  Your symptoms get worse.  You have new symptoms.  You develop blisters in or around your vagina.  You have blood coming from your vagina and it is not your menstrual period.  You develop pain in your abdomen. Summary  Vaginal yeast infection is a condition that causes discharge as well as soreness, swelling, and redness (inflammation) of the vagina.  This condition is treated with medicine. Medicines may be over-the-counter or prescription.  Take or apply over-the-counter and prescription medicines only as told by your health care provider.  Do not douche. Do not have sex or use tampons until your health care provider approves.  Contact a health care provider if your symptoms do not get better with treatment or your symptoms go away and then return. This information is not intended to replace advice given to you by your health care provider. Make sure you discuss any questions you have with your health care provider. Document Released: 08/16/2005 Document Revised: 03/25/2018 Document Reviewed: 03/25/2018 Elsevier Interactive Patient Education  2019 [employer]

## 2018-12-20 NOTE — Telephone Encounter (Signed)
Patient called and stated that she believes she has a yeast infection. Patient stated that she is having "clumpy discharge and odor."

## 2019-03-11 ENCOUNTER — Other Ambulatory Visit: Payer: Self-pay | Admitting: Obstetrics and Gynecology

## 2019-03-11 MED ORDER — NORETHIN ACE-ETH ESTRAD-FE 1-20 MG-MCG PO TABS: 1.0000 | ORAL_TABLET | Freq: Every day | ORAL | 0 refills | Status: DC

## 2019-03-19 ENCOUNTER — Ambulatory Visit: Payer: BLUE CROSS/BLUE SHIELD | Admitting: Obstetrics and Gynecology

## 2019-03-20 ENCOUNTER — Ambulatory Visit: Payer: BLUE CROSS/BLUE SHIELD | Admitting: Obstetrics and Gynecology

## 2019-04-08 ENCOUNTER — Other Ambulatory Visit: Payer: Self-pay

## 2019-04-10 ENCOUNTER — Other Ambulatory Visit: Payer: Self-pay

## 2019-04-10 ENCOUNTER — Ambulatory Visit (INDEPENDENT_AMBULATORY_CARE_PROVIDER_SITE_OTHER): Payer: BLUE CROSS/BLUE SHIELD | Admitting: Obstetrics and Gynecology

## 2019-04-10 ENCOUNTER — Encounter: Payer: Self-pay | Admitting: Obstetrics and Gynecology

## 2019-04-10 ENCOUNTER — Other Ambulatory Visit (HOSPITAL_COMMUNITY)
Admission: RE | Admit: 2019-04-10 | Discharge: 2019-04-10 | Disposition: A | Discharge status: Home / Self Care | Payer: BLUE CROSS/BLUE SHIELD | Source: Ambulatory Visit | Attending: Obstetrics and Gynecology | Admitting: Obstetrics and Gynecology

## 2019-04-10 VITALS — BP 110/78 | HR 70 | Temp 98.2°F | Resp 16 | Ht 60.75 in | Wt 152.0 lb

## 2019-04-10 DIAGNOSIS — Z113 Encounter for screening for infections with a predominantly sexual mode of transmission: Secondary | ICD-10-CM | POA: Insufficient documentation

## 2019-04-10 DIAGNOSIS — N760 Acute vaginitis: Secondary | ICD-10-CM | POA: Diagnosis not present

## 2019-04-10 DIAGNOSIS — Z124 Encounter for screening for malignant neoplasm of cervix: Secondary | ICD-10-CM

## 2019-04-10 DIAGNOSIS — E559 Vitamin D deficiency, unspecified: Secondary | ICD-10-CM | POA: Diagnosis not present

## 2019-04-10 DIAGNOSIS — Z23 Encounter for immunization: Secondary | ICD-10-CM | POA: Diagnosis not present

## 2019-04-10 DIAGNOSIS — Z0000 Encounter for general adult medical examination without abnormal findings: Secondary | ICD-10-CM

## 2019-04-10 DIAGNOSIS — Z01419 Encounter for gynecological examination (general) (routine) without abnormal findings: Secondary | ICD-10-CM | POA: Diagnosis not present

## 2019-04-10 MED ORDER — BETAMETHASONE VALERATE 0.1 % EX OINT: 1.0000 "application " | TOPICAL_OINTMENT | Freq: Two times a day (BID) | CUTANEOUS | 0 refills | Status: DC

## 2019-04-10 MED ORDER — FLUCONAZOLE 150 MG PO TABS: 150.0000 mg | ORAL_TABLET | Freq: Once | ORAL | 0 refills | Status: AC

## 2019-04-10 MED ORDER — NORETHIN ACE-ETH ESTRAD-FE 1-20 MG-MCG PO TABS: 1.0000 | ORAL_TABLET | Freq: Every day | ORAL | 3 refills | Status: DC

## 2019-04-10 NOTE — Progress Notes (Signed)
28 y.o. G0P0000 Single Black or African American Not Hispanic or Latino female here for annual exam.   Period Cycle (Days): 28 Period Pattern: Regular Menstrual Flow: Moderate Menstrual Control: Maxi pad Dysmenorrhea: (!) Moderate Dysmenorrhea Symptoms: Cramping, Nausea, Headache  Sexually active, same long term partner, no pain.  Started on OCP's in 1/20, doing well, cycles are lighter and less cramping.  She c/o a 3 day h/o an increased clumpy, white vaginal dc, with itching and irritation.   Patient's last menstrual period was 03/31/2019 (exact date).          Sexually active: Yes.    The current method of family planning is OCP (estrogen/progesterone).    Exercising: Yes.    circuit Smoker:  no  Health Maintenance: Pap:  ?last year. History of abnormal Pap:  no TDaP:  Unsure Gardasil: completed all 3 per patient    reports that she has never smoked. She has never used smokeless tobacco. She reports current alcohol use. She reports that she does not use drugs. Rare ETOH. She is a Training and development officer for [employer]. Lives with her Mom. Has 2 sisters, not at home.   Past Medical History:  Diagnosis Date  . Dysmenorrhea     Past Surgical History:  Procedure Laterality Date  . CYST REMOVAL HAND    . WISDOM TOOTH EXTRACTION      Current Outpatient Medications  Medication Sig Dispense Refill  . cetirizine (ZYRTEC) 10 MG tablet Take 2 tablets by mouth daily.    . norethindrone-ethinyl estradiol (LOESTRIN FE) 1-20 MG-MCG tablet Take 1 tablet by mouth daily. 1 Package 0   No current facility-administered medications for this visit.     Family History  Problem Relation Age of Onset  . Breast cancer Paternal Aunt   Celine Ahr was in her 7's  Review of Systems  Constitutional: Negative.   HENT: Negative.   Eyes: Negative.   Respiratory: Negative.   Cardiovascular: Negative.   Gastrointestinal: Negative.   Endocrine: Negative.   Genitourinary: Positive for vaginal discharge.   Vaginal itching & irritation  Musculoskeletal: Negative.   Skin: Negative.   Allergic/Immunologic: Negative.   Neurological: Negative.   Psychiatric/Behavioral: Negative.     Exam:   BP 110/78   Pulse 70   Temp 98.2 F (36.8 C) (Skin)   Resp 16   Ht 5' 0.75" (1.543 m)   Wt 152 lb (68.9 kg)   LMP 03/31/2019 (Exact Date)   BMI 28.96 kg/m   Weight change: @WEIGHTCHANGE @ Height:   Height: 5' 0.75" (154.3 cm)  Ht Readings from Last 3 Encounters:  04/10/19 5' 0.75" (1.543 m)  12/20/18 5' (1.524 m)  09/27/18 5' (1.524 m)    General appearance: alert, cooperative and appears stated age Head: Normocephalic, without obvious abnormality, atraumatic Neck: no adenopathy, supple, symmetrical, trachea midline and thyroid normal to inspection and palpation Lungs: clear to auscultation bilaterally Cardiovascular: regular rate and rhythm Breasts: normal appearance, no masses or tenderness Abdomen: soft, non-tender; non distended,  no masses,  no organomegaly Extremities: extremities normal, atraumatic, no cyanosis or edema Skin: Skin color, texture, turgor normal. No rashes or lesions Lymph nodes: Cervical, supraclavicular, and axillary nodes normal. No abnormal inguinal nodes palpated Neurologic: Grossly normal   Pelvic: External genitalia:  no lesions              Urethra:  normal appearing urethra with no masses, tenderness or lesions              Bartholins and Skenes:  normal                 Vagina:slightly erythematous appearing vagina with an increase in white watery and thick vaginal discharge              Cervix: no cervical motion tenderness and no lesions               Bimanual Exam:  Uterus:  normal size, contour, position, consistency, mobility, non-tender              Adnexa: no mass, fullness, tenderness               Rectovaginal: Confirms               Anus:  normal sphincter tone, no lesions  Chaperone was present for exam.  Wet prep: no clue, no trich, + wbc KOH:  + yeast PH: 4   A:  Well Woman with normal exam  Yeast Vaginitis  Immunization due    P:   Desires full STD testing including HSV serology  Screening labs  Discussed breast self exam  Discussed calcium and vit D intake  TDAP  Pap with GC/CT, reflex hpv  Diflucan and steroid ointment for yeast

## 2019-04-10 NOTE — Patient Instructions (Addendum)
EXERCISE AND DIET:  We recommended that you start or continue a regular exercise program for good health. Regular exercise means any activity that makes your heart beat faster and makes you sweat.  We recommend exercising at least 30 minutes per day at least 3 days a week, preferably 4 or 5.  We also recommend a diet low in fat and sugar.  Inactivity, poor dietary choices and obesity can cause diabetes, heart attack, stroke, and kidney damage, among others.    ALCOHOL AND SMOKING:  Women should limit their alcohol intake to no more than 7 drinks/beers/glasses of wine (combined, not each!) per week. Moderation of alcohol intake to this level decreases your risk of breast cancer and liver damage. And of course, no recreational drugs are part of a healthy lifestyle.  And absolutely no smoking or even second hand smoke. Most people know smoking can cause heart and lung diseases, but did you know it also contributes to weakening of your bones? Aging of your skin?  Yellowing of your teeth and nails?  CALCIUM AND VITAMIN D:  Adequate intake of calcium and Vitamin D are recommended.  The recommendations for exact amounts of these supplements seem to change often, but generally speaking 1,000 mg of calcium (between diet and supplement) and 800 units of Vitamin D per day seems prudent. Certain women may benefit from higher intake of Vitamin D.  If you are among these women, your doctor will have told you during your visit.    PAP SMEARS:  Pap smears, to check for cervical cancer or precancers,  have traditionally been done yearly, although recent scientific advances have shown that most women can have pap smears less often.  However, every woman still should have a physical exam from her gynecologist every year. It will include a breast check, inspection of the vulva and vagina to check for abnormal growths or skin changes, a visual exam of the cervix, and then an exam to evaluate the size and shape of the uterus and  ovaries.  And after 26 years of age, a rectal exam is indicated to check for rectal cancers. We will also provide age appropriate advice regarding health maintenance, like when you should have certain vaccines, screening for sexually transmitted diseases, bone density testing, colonoscopy, mammograms, etc.   MAMMOGRAMS:  All women over 40 years old should have a yearly mammogram. Many facilities now offer a "3D" mammogram, which may cost around $50 extra out of pocket. If possible,  we recommend you accept the option to have the 3D mammogram performed.  It both reduces the number of women who will be called back for extra views which then turn out to be normal, and it is better than the routine mammogram at detecting truly abnormal areas.    COLON CANCER SCREENING: Now recommend starting at age 45. At this time colonoscopy is not covered for routine screening until 50. There are take home tests that can be done between 45-49.   COLONOSCOPY:  Colonoscopy to screen for colon cancer is recommended for all women at age 50.  We know, you hate the idea of the prep.  We agree, BUT, having colon cancer and not knowing it is worse!!  Colon cancer so often starts as a polyp that can be seen and removed at colonscopy, which can quite literally save your life!  And if your first colonoscopy is normal and you have no family history of colon cancer, most women don't have to have it again for   10 years.  Once every ten years, you can do something that may end up saving your life, right?  We will be happy to help you get it scheduled when you are ready.  Be sure to check your insurance coverage so you understand how much it will cost.  It may be covered as a preventative service at no cost, but you should check your particular policy.      Breast Self-Awareness Breast self-awareness means being familiar with how your breasts look and feel. It involves checking your breasts regularly and reporting any changes to your  health care provider. Practicing breast self-awareness is important. A change in your breasts can be a sign of a serious medical problem. Being familiar with how your breasts look and feel allows you to find any problems early, when treatment is more likely to be successful. All women should practice breast self-awareness, including women who have had breast implants. How to do a breast self-exam One way to learn what is normal for your breasts and whether your breasts are changing is to do a breast self-exam. To do a breast self-exam: Look for Changes  1. Remove all the clothing above your waist. 2. Stand in front of a mirror in a room with good lighting. 3. Put your hands on your hips. 4. Push your hands firmly downward. 5. Compare your breasts in the mirror. Look for differences between them (asymmetry), such as: ? Differences in shape. ? Differences in size. ? Puckers, dips, and bumps in one breast and not the other. 6. Look at each breast for changes in your skin, such as: ? Redness. ? Scaly areas. 7. Look for changes in your nipples, such as: ? Discharge. ? Bleeding. ? Dimpling. ? Redness. ? A change in position. Feel for Changes Carefully feel your breasts for lumps and changes. It is best to do this while lying on your back on the floor and again while sitting or standing in the shower or tub with soapy water on your skin. Feel each breast in the following way:  Place the arm on the side of the breast you are examining above your head.  Feel your breast with the other hand.  Start in the nipple area and make  inch (2 cm) overlapping circles to feel your breast. Use the pads of your three middle fingers to do this. Apply light pressure, then medium pressure, then firm pressure. The light pressure will allow you to feel the tissue closest to the skin. The medium pressure will allow you to feel the tissue that is a little deeper. The firm pressure will allow you to feel the tissue  close to the ribs.  Continue the overlapping circles, moving downward over the breast until you feel your ribs below your breast.  Move one finger-width toward the center of the body. Continue to use the  inch (2 cm) overlapping circles to feel your breast as you move slowly up toward your collarbone.  Continue the up and down exam using all three pressures until you reach your armpit.  Write Down What You Find  Write down what is normal for each breast and any changes that you find. Keep a written record with breast changes or normal findings for each breast. By writing this information down, you do not need to depend only on memory for size, tenderness, or location. Write down where you are in your menstrual cycle, if you are still menstruating. If you are having trouble noticing differences   in your breasts, do not get discouraged. With time you will become more familiar with the variations in your breasts and more comfortable with the exam. How often should I examine my breasts? Examine your breasts every month. If you are breastfeeding, the best time to examine your breasts is after a feeding or after using a breast pump. If you menstruate, the best time to examine your breasts is 5-7 days after your period is over. During your period, your breasts are lumpier, and it may be more difficult to notice changes. When should I see my health care provider? See your health care provider if you notice:  A change in shape or size of your breasts or nipples.  A change in the skin of your breast or nipples, such as a reddened or scaly area.  Unusual discharge from your nipples.  A lump or thick area that was not there before.  Pain in your breasts.  Anything that concerns you.  Vaginitis Vaginitis is a condition in which the vaginal tissue swells and becomes red (inflamed). This condition is most often caused by a change in the normal balance of bacteria and yeast that live in the vagina.  This change causes an overgrowth of certain bacteria or yeast, which causes the inflammation. There are different types of vaginitis, but the most common types are:  Bacterial vaginosis.  Yeast infection (candidiasis).  Trichomoniasis vaginitis. This is a sexually transmitted disease (STD).  Viral vaginitis.  Atrophic vaginitis.  Allergic vaginitis. What are the causes? The cause of this condition depends on the type of vaginitis. It can be caused by:  Bacteria (bacterial vaginosis).  Yeast, which is a fungus (yeast infection).  A parasite (trichomoniasis vaginitis).  A virus (viral vaginitis).  Low hormone levels (atrophic vaginitis). Low hormone levels can occur during pregnancy, breastfeeding, or after menopause.  Irritants, such as bubble baths, scented tampons, and feminine sprays (allergic vaginitis). Other factors can change the normal balance of the yeast and bacteria that live in the vagina. These include:  Antibiotic medicines.  Poor hygiene.  Diaphragms, vaginal sponges, spermicides, birth control pills, and intrauterine devices (IUD).  Sex.  Infection.  Uncontrolled diabetes.  A weakened defense (immune) system. What increases the risk? This condition is more likely to develop in women who:  Smoke.  Use vaginal douches, scented tampons, or scented sanitary pads.  Wear tight-fitting pants.  Wear thong underwear.  Use oral birth control pills or an IUD.  Have sex without a condom.  Have multiple sex partners.  Have an STD.  Frequently use the spermicide nonoxynol-9.  Eat lots of foods high in sugar.  Have uncontrolled diabetes.  Have low estrogen levels.  Have a weakened immune system from an immune disorder or medical treatment.  Are pregnant or breastfeeding. What are the signs or symptoms? Symptoms vary depending on the cause of the vaginitis. Common symptoms include:  Abnormal vaginal discharge. ? The discharge is white, gray,  or yellow with bacterial vaginosis. ? The discharge is thick, white, and cheesy with a yeast infection. ? The discharge is frothy and yellow or greenish with trichomoniasis.  A bad vaginal smell. The smell is fishy with bacterial vaginosis.  Vaginal itching, pain, or swelling.  Sex that is painful.  Pain or burning when urinating. Sometimes there are no symptoms. How is this diagnosed? This condition is diagnosed based on your symptoms and medical history. A physical exam, including a pelvic exam, will also be done. You may also have other   tests, including:  Tests to determine the pH level (acidity or alkalinity) of your vagina.  A whiff test, to assess the odor that results when a sample of your vaginal discharge is mixed with a potassium hydroxide solution.  Tests of vaginal fluid. A sample will be examined under a microscope. How is this treated? Treatment varies depending on the type of vaginitis you have. Your treatment may include:  Antibiotic creams or pills to treat bacterial vaginosis and trichomoniasis.  Antifungal medicines, such as vaginal creams or suppositories, to treat a yeast infection.  Medicine to ease discomfort if you have viral vaginitis. Your sexual partner should also be treated.  Estrogen delivered in a cream, pill, suppository, or vaginal ring to treat atrophic vaginitis. If vaginal dryness occurs, lubricants and moisturizing creams may help. You may need to avoid scented soaps, sprays, or douches.  Stopping use of a product that is causing allergic vaginitis. Then using a vaginal cream to treat the symptoms. Follow these instructions at home: Lifestyle  Keep your genital area clean and dry. Avoid soap, and only rinse the area with water.  Do not douche or use tampons until your health care provider says it is okay to do so. Use sanitary pads, if needed.  Do not have sex until your health care provider approves. When you can return to sex, practice  safe sex and use condoms.  Wipe from front to back. This avoids the spread of bacteria from the rectum to the vagina. General instructions  Take over-the-counter and prescription medicines only as told by your health care provider.  If you were prescribed an antibiotic medicine, take or use it as told by your health care provider. Do not stop taking or using the antibiotic even if you start to feel better.  Keep all follow-up visits as told by your health care provider. This is important. How is this prevented?  Use mild, non-scented products. Do not use things that can irritate the vagina, such as fabric softeners. Avoid the following products if they are scented: ? Feminine sprays. ? Detergents. ? Tampons. ? Feminine hygiene products. ? Soaps or bubble baths.  Let air reach your genital area. ? Wear cotton underwear to reduce moisture buildup. ? Avoid wearing underwear while you sleep. ? Avoid wearing tight pants and underwear or nylons without a cotton panel. ? Avoid wearing thong underwear.  Take off any wet clothing, such as bathing suits, as soon as possible.  Practice safe sex and use condoms. Contact a health care provider if:  You have abdominal pain.  You have a fever.  You have symptoms that last for more than 2-3 days. Get help right away if:  You have a fever and your symptoms suddenly get worse. Summary  Vaginitis is a condition in which the vaginal tissue becomes inflamed.This condition is most often caused by a change in the normal balance of bacteria and yeast that live in the vagina.  Treatment varies depending on the type of vaginitis you have.  Do not douche, use tampons , or have sex until your health care provider approves. When you can return to sex, practice safe sex and use condoms. This information is not intended to replace advice given to you by your health care provider. Make sure you discuss any questions you have with your health care  provider. Document Released: 09/03/2007 Document Revised: 12/12/2016 Document Reviewed: 12/12/2016 Elsevier Interactive Patient Education  2019 [employer]  

## 2019-04-11 LAB — CBC
Hematocrit: 43 % (ref 34.0–46.6)
Hemoglobin: 13.7 g/dL (ref 11.1–15.9)
MCH: 28.1 pg (ref 26.6–33.0)
MCHC: 31.9 g/dL (ref 31.5–35.7)
MCV: 88 fL (ref 79–97)
Platelets: 305 10*3/uL (ref 150–450)
RBC: 4.87 x10E6/uL (ref 3.77–5.28)
RDW: 12.8 % (ref 11.7–15.4)
WBC: 5.7 10*3/uL (ref 3.4–10.8)

## 2019-04-11 LAB — COMPREHENSIVE METABOLIC PANEL
ALT: 17 IU/L (ref 0–32)
AST: 13 IU/L (ref 0–40)
Albumin/Globulin Ratio: 1.6 (ref 1.2–2.2)
Albumin: 4.1 g/dL (ref 3.9–5.0)
Alkaline Phosphatase: 48 IU/L (ref 39–117)
BUN/Creatinine Ratio: 12 (ref 9–23)
BUN: 9 mg/dL (ref 6–20)
Bilirubin Total: 0.4 mg/dL (ref 0.0–1.2)
CO2: 20 mmol/L (ref 20–29)
Calcium: 9.2 mg/dL (ref 8.7–10.2)
Chloride: 102 mmol/L (ref 96–106)
Creatinine, Ser: 0.74 mg/dL (ref 0.57–1.00)
GFR calc Af Amer: 130 mL/min/{1.73_m2} (ref >59)
GFR calc non Af Amer: 113 mL/min/{1.73_m2} (ref >59)
Globulin, Total: 2.5 g/dL (ref 1.5–4.5)
Glucose: 83 mg/dL (ref 65–99)
Potassium: 4.5 mmol/L (ref 3.5–5.2)
Sodium: 136 mmol/L (ref 134–144)
Total Protein: 6.6 g/dL (ref 6.0–8.5)

## 2019-04-11 LAB — LIPID PANEL
Chol/HDL Ratio: 4.1 ratio (ref 0.0–4.4)
Cholesterol, Total: 233 mg/dL — ABNORMAL HIGH (ref 100–199)
HDL: 57 mg/dL (ref >39)
LDL Calculated: 151 mg/dL — ABNORMAL HIGH (ref 0–99)
Triglycerides: 123 mg/dL (ref 0–149)
VLDL Cholesterol Cal: 25 mg/dL (ref 5–40)

## 2019-04-11 LAB — HSV(HERPES SIMPLEX VRS) I + II AB-IGG
HSV 1 Glycoprotein G Ab, IgG: <0.91 index (ref 0.00–0.90)
HSV 2 IgG, Type Spec: <0.91 index (ref 0.00–0.90)

## 2019-04-11 LAB — VITAMIN D 25 HYDROXY (VIT D DEFICIENCY, FRACTURES): Vit D, 25-Hydroxy: 26.9 ng/mL — ABNORMAL LOW (ref 30.0–100.0)

## 2019-04-11 LAB — HEP, RPR, HIV PANEL
HIV Screen 4th Generation wRfx: NONREACTIVE
Hepatitis B Surface Ag: NEGATIVE
RPR Ser Ql: NONREACTIVE

## 2019-04-11 LAB — HEPATITIS C ANTIBODY: Hep C Virus Ab: <0.1 s/co ratio (ref 0.0–0.9)

## 2019-04-16 LAB — CYTOLOGY - PAP
Chlamydia: NEGATIVE
Diagnosis: UNDETERMINED — AB
HPV: NOT DETECTED
Neisseria Gonorrhea: NEGATIVE
Trichomonas: NEGATIVE

## 2019-05-14 DIAGNOSIS — Z20828 Contact with and (suspected) exposure to other viral communicable diseases: Secondary | ICD-10-CM | POA: Diagnosis not present

## 2019-07-17 DIAGNOSIS — Z20828 Contact with and (suspected) exposure to other viral communicable diseases: Secondary | ICD-10-CM | POA: Diagnosis not present

## 2019-08-26 ENCOUNTER — Telehealth: Payer: BLUE CROSS/BLUE SHIELD | Admitting: Physician Assistant

## 2019-08-26 DIAGNOSIS — J069 Acute upper respiratory infection, unspecified: Secondary | ICD-10-CM | POA: Diagnosis not present

## 2019-08-26 MED ORDER — FLUTICASONE PROPIONATE 50 MCG/ACT NA SUSP: 2.0000 | Freq: Every day | NASAL | 0 refills | Status: DC

## 2019-08-26 MED ORDER — AMOXICILLIN-POT CLAVULANATE 875-125 MG PO TABS: 1.0000 | ORAL_TABLET | Freq: Two times a day (BID) | ORAL | 0 refills | Status: AC

## 2019-08-26 NOTE — Progress Notes (Signed)
We are sorry you are not feeling well.  Here is how we plan to help!  Based on what you have shared with me, it looks like you may have a viral upper respiratory infection.  Upper respiratory infections are caused by a large number of viruses; however, rhinovirus is the most common cause.   Symptoms vary from person to person, with common symptoms including sore throat, cough, fatigue or lack of energy and feeling of general discomfort.  A low-grade fever of up to 100.4 may present, but is often uncommon.  Symptoms vary however, and are closely related to a person's age or underlying illnesses.  The most common symptoms associated with an upper respiratory infection are nasal discharge or congestion, cough, sneezing, headache and pressure in the ears and face.  These symptoms usually persist for about 3 to 10 days, but can last up to 2 weeks.  It is important to know that upper respiratory infections do not cause serious illness or complications in most cases.    Upper respiratory infections can be transmitted from person to person, with the most common method of transmission being a person's hands.  The virus is able to live on the skin and can infect other persons for up to 2 hours after direct contact.  Also, these can be transmitted when someone coughs or sneezes; thus, it is important to cover the mouth to reduce this risk.  To keep the spread of the illness at bay, good hand hygiene is very important.  This is an infection that is most likely caused by a virus. There are no specific treatments other than to help you with the symptoms until the infection runs its course.  We are sorry you are not feeling well.  Here is how we plan to help!   For nasal congestion, you may use an oral decongestants such as Mucinex D or if you have glaucoma or high blood pressure use plain Mucinex.  Saline nasal spray or nasal drops can help and can safely be used as often as needed for congestion.  For your congestion,  I have prescribed Fluticasone nasal spray one spray in each nostril twice a day. Additionally, I have also prescribed an antibiotic called Augmentin for a potential bacterial infection of the sinuses since your symptoms have been persistent for 7 days.. This antibiotic will also cover strep throat.   If you do not have a history of heart disease, hypertension, diabetes or thyroid disease, prostate/bladder issues or glaucoma, you may also use Sudafed to treat nasal congestion.  It is highly recommended that you consult with a pharmacist or your primary care physician to ensure this medication is safe for you to take.      If you have a sore or scratchy throat, use a saltwater gargle-  to  teaspoon of salt dissolved in a 4-ounce to 8-ounce glass of warm water.  Gargle the solution for approximately 15-30 seconds and then spit.  It is important not to swallow the solution.  You can also use throat lozenges/cough drops and Chloraseptic spray to help with throat pain or discomfort. You may also take tylenol to help with your symptoms. Warm or cold liquids can also be helpful in relieving throat pain.  For headache, pain or general discomfort, you can use Ibuprofen or Tylenol as directed.   Some authorities believe that zinc sprays or the use of Echinacea may shorten the course of your symptoms.   HOME CARE . Only take medications as  instructed by your medical team. . Be sure to drink plenty of fluids. Water is fine as well as fruit juices, sodas and electrolyte beverages. You may want to stay away from caffeine or alcohol. If you are nauseated, try taking small sips of liquids. How do you know if you are getting enough fluid? Your urine should be a pale yellow or almost colorless. . Get rest. . Taking a steamy shower or using a humidifier may help nasal congestion and ease sore throat pain. You can place a towel over your head and breathe in the steam from hot water coming from a faucet. . Using a  saline nasal spray works much the same way. . Cough drops, hard candies and sore throat lozenges may ease your cough. . Avoid close contacts especially the very young and the elderly . Cover your mouth if you cough or sneeze . Always remember to wash your hands.   GET HELP RIGHT AWAY IF: . You develop worsening fever. . If your symptoms do not improve within 10 days . You develop yellow or green discharge from your nose over 3 days. . You have coughing fits . You develop a severe head ache or visual changes. . You develop shortness of breath, difficulty breathing or start having chest pain . Your symptoms persist after you have completed your treatment plan  MAKE SURE YOU   Understand these instructions.  Will watch your condition.  Will get help right away if you are not doing well or get worse.  Your e-visit answers were reviewed by a board certified advanced clinical practitioner to complete your personal care plan. Depending upon the condition, your plan could have included both over the counter or prescription medications. Please review your pharmacy choice. If there is a problem, you may call our nursing hot line at and have the prescription routed to another pharmacy. Your safety is important to [place]. If you have drug allergies check your prescription carefully.   You can use MyChart to ask questions about today's visit, request a non-urgent call back, or ask for a work or school excuse for 24 hours related to this e-Visit. If it has been greater than 24 hours you will need to follow up with your provider, or enter a new e-Visit to address those concerns. You will get an e-mail in the next two days asking about your experience.  I hope that your e-visit has been valuable and will speed your recovery. Thank you for using e-visits.  Greater than 5 minutes, yet less than 10 minutes of time have been spend researching, coordinating, and implementing care for this patient  today.

## 2020-01-08 DIAGNOSIS — H6501 Acute serous otitis media, right ear: Secondary | ICD-10-CM | POA: Diagnosis not present

## 2020-02-03 DIAGNOSIS — H40013 Open angle with borderline findings, low risk, bilateral: Secondary | ICD-10-CM | POA: Diagnosis not present

## 2020-02-10 DIAGNOSIS — H9203 Otalgia, bilateral: Secondary | ICD-10-CM | POA: Insufficient documentation

## 2020-02-10 DIAGNOSIS — F458 Other somatoform disorders: Secondary | ICD-10-CM | POA: Diagnosis not present

## 2020-03-12 ENCOUNTER — Telehealth: Payer: Self-pay | Admitting: Obstetrics and Gynecology

## 2020-03-12 ENCOUNTER — Encounter: Payer: Self-pay | Admitting: Obstetrics and Gynecology

## 2020-03-12 ENCOUNTER — Other Ambulatory Visit: Payer: Self-pay

## 2020-03-12 ENCOUNTER — Ambulatory Visit (INDEPENDENT_AMBULATORY_CARE_PROVIDER_SITE_OTHER): Payer: BC Managed Care – PPO | Admitting: Obstetrics and Gynecology

## 2020-03-12 VITALS — BP 110/64 | HR 86 | Temp 97.6°F | Resp 14 | Ht 60.0 in | Wt 157.1 lb

## 2020-03-12 DIAGNOSIS — N760 Acute vaginitis: Secondary | ICD-10-CM

## 2020-03-12 DIAGNOSIS — N644 Mastodynia: Secondary | ICD-10-CM

## 2020-03-12 DIAGNOSIS — Z113 Encounter for screening for infections with a predominantly sexual mode of transmission: Secondary | ICD-10-CM

## 2020-03-12 NOTE — Progress Notes (Signed)
GYNECOLOGY  VISIT   HPI: 29 y.o.   Single  African American  female   [id number] with Patient's last menstrual period was 02/19/2020.   here for STD screening. She is concerned about potential exposures.  She wants throat testing for STDs.  States it does not feel quite right but is not painful. She had strep testing, which was negative.  She is not sure if she has allergies or not.  Denies fever, congestion, cough, nausea, vomiting or diarrhea.  No exposure to Covid.  No prior vaccination.   Having some vaginal itching for 1 - 2 weeks.  No discharge.   She reports shooting pain on her left lower abdomen since 2018.  States this occurs when she is about to start her menstruation.   Some right breast tenderness deep behind the nipple.  This comes and goes for a year. No known mass.  Nothing makes pain better or worse.  Wants evaluation.   GYNECOLOGIC HISTORY: Patient's last menstrual period was 02/19/2020. Contraception: none Menopausal hormone therapy:  none Last mammogram:  n/a Last pap smear:   04-10-2019 ASCUS, negative HPV        OB History    Gravida  0   Para  0   Term  0   Preterm  0   AB  0   Living  0     SAB  0   TAB  0   Ectopic  0   Multiple  0   Live Births  0              There are no problems to display for this patient.   Past Medical History:  Diagnosis Date  . Dysmenorrhea     Past Surgical History:  Procedure Laterality Date  . CYST REMOVAL HAND    . WISDOM TOOTH EXTRACTION      Current Outpatient Medications  Medication Sig Dispense Refill  . cetirizine (ZYRTEC) 10 MG tablet Take 2 tablets by mouth daily.    . Multiple Vitamin (MULTIVITAMIN) capsule Take 1 capsule by mouth daily.    Marland Kitchen PREVIDENT 5000 ENAMEL PROTECT 1.1-5 % PSTE USE 3-4 TIMES A WEEK AT NIGHT BEFORE BEDTIME    . Probiotic Product (PROBIOTIC PO) Take by mouth.     No current facility-administered medications for this visit.     ALLERGIES: Aspirin and  Ibuprofen  Family History  Problem Relation Age of Onset  . Breast cancer Paternal Aunt     Social History   Socioeconomic History  . Marital status: Single    Spouse name: Not on file  . Number of children: Not on file  . Years of education: Not on file  . Highest education level: Not on file  Occupational History  . Not on file  Tobacco Use  . Smoking status: Never Smoker  . Smokeless tobacco: Never Used  Substance and Sexual Activity  . Alcohol use: Yes    Comment: occassionally  . Drug use: Never  . Sexual activity: Yes    Partners: Male    Birth control/protection: None, OCP  Other Topics Concern  . Not on file  Social History Narrative  . Not on file   Social Determinants of Health   Financial Resource Strain:   . Difficulty of Paying Living Expenses:   Food Insecurity:   . Worried About Programme researcher, broadcasting/film/video in the Last Year:   . Barista in the Last Year:   Transportation Needs:   .  Lack of Transportation (Medical):   Marland Kitchen Lack of Transportation (Non-Medical):   Physical Activity:   . Days of Exercise per Week:   . Minutes of Exercise per Session:   Stress:   . Feeling of Stress :   Social Connections:   . Frequency of Communication with Friends and Family:   . Frequency of Social Gatherings with Friends and Family:   . Attends Religious Services:   . Active Member of Clubs or Organizations:   . Attends Archivist Meetings:   Marland Kitchen Marital Status:   Intimate Partner Violence:   . Fear of Current or Ex-Partner:   . Emotionally Abused:   Marland Kitchen Physically Abused:   . Sexually Abused:     Review of Systems  Constitutional: Negative.   HENT: Negative.   Eyes: Negative.   Respiratory: Negative.   Cardiovascular: Negative.   Gastrointestinal: Negative.   Endocrine: Negative.   Genitourinary: Negative.        Right breast tenderness  Musculoskeletal: Negative.   Skin: Negative.   Allergic/Immunologic: Negative.   Neurological: Negative.    Hematological: Negative.   Psychiatric/Behavioral: Negative.     PHYSICAL EXAMINATION:    BP 110/64 (BP Location: Right Arm, Patient Position: Sitting, Cuff Size: Normal)   Pulse 86   Temp 97.6 F (36.4 C) (Skin)   Resp 14   Ht 5' (1.524 m)   Wt 157 lb 1.6 oz (71.3 kg)   LMP 02/19/2020   BMI 30.68 kg/m     General appearance: alert, cooperative and appears stated age Breasts: normal appearance, no masses or tenderness, No nipple retraction or dimpling, No nipple discharge or bleeding, No axillary adenopathy.   Pelvic: External genitalia:  no lesions              Urethra:  normal appearing urethra with no masses, tenderness or lesions              Bartholins and Skenes: normal                 Vagina: normal appearing vagina with normal color and discharge, no lesions              Cervix: no lesions                Bimanual Exam:  Uterus:  normal size, contour, position, consistency, mobility, non-tender              Adnexa: no mass, fullness, tenderness           Chaperone was present for exam.  ASSESSMENT  STD screening.  Vaginitis.  Right breast pain.  Chronic. No mass.   PLAN  STD screening and vaginitis testing. Pharyngeal and cervical STD screening performed separately.  Will schedule right breast US.     An After Visit Summary was printed and given to the patient.  __20____ minutes face to face time of which over 50% was spent in counseling.

## 2020-03-12 NOTE — Telephone Encounter (Signed)
Please contact patient to [facility] right breast pain behind the nipple.   I do not feel a mass on exam.

## 2020-03-12 NOTE — Telephone Encounter (Signed)
Spoke with Scarlette Calico at [facility] Patient scheduled for right breast US on 03/24/20 at 1:20pm, arrive at 1:10pm.   Spoke with patient, advised of appt as seen above. Patient verbalizes understanding and is agreeable.   Placed in [facility] hold.   Routing to provider for final review. Patient is agreeable to disposition. Will close encounter.

## 2020-03-13 LAB — CT/GC NAA, PHARYNGEAL
C TRACH RRNA NPH QL PCR: NEGATIVE
N GONORRHOEA RRNA NPH QL PCR: NEGATIVE

## 2020-03-14 LAB — NUSWAB VAGINITIS PLUS (VG+)
Candida albicans, NAA: NEGATIVE
Candida glabrata, NAA: NEGATIVE
Chlamydia trachomatis, NAA: NEGATIVE
Neisseria gonorrhoeae, NAA: NEGATIVE
Trich vag by NAA: NEGATIVE

## 2020-03-14 LAB — HEP, RPR, HIV PANEL
HIV Screen 4th Generation wRfx: NONREACTIVE
Hepatitis B Surface Ag: NEGATIVE
RPR Ser Ql: NONREACTIVE

## 2020-03-14 LAB — HEPATITIS C ANTIBODY: Hep C Virus Ab: <0.1 s/co ratio (ref 0.0–0.9)

## 2020-03-24 ENCOUNTER — Ambulatory Visit
Admission: RE | Admit: 2020-03-24 | Discharge: 2020-03-24 | Disposition: A | Discharge status: Home / Self Care | Payer: BC Managed Care – PPO | Source: Ambulatory Visit | Attending: Obstetrics and Gynecology | Admitting: Obstetrics and Gynecology

## 2020-03-24 ENCOUNTER — Other Ambulatory Visit: Payer: Self-pay

## 2020-03-24 DIAGNOSIS — N644 Mastodynia: Secondary | ICD-10-CM | POA: Diagnosis not present

## 2020-04-13 NOTE — Progress Notes (Signed)
29 y.o. [place] or African American Not Hispanic or Latino female here for annual exam.  Same partner x 1.5 years, no pain. Using condoms.  Period Cycle (Days): 28 Period Duration (Days): 5-7 Period Pattern: Regular Menstrual Flow: Heavy(She has a lot of clots) Menstrual Control: Tampon, Thin pad Menstrual Control Change Freq (Hours): 2 Dysmenorrhea: (!) Moderate Dysmenorrhea Symptoms: Cramping, Nausea  Cycles are tolerable. Midol helps.   Patient's last menstrual period was 04/10/2020.          Sexually active: Yes.    The current method of family planning is condoms all the time.    Exercising: Yes.    weight training and cardio Smoker:  no  Health Maintenance: Pap:   04/10/19 ASCUS HPV neg  History of abnormal Pap:  yes TDaP:  04/10/19  Gardasil: Completed 3 per patient    reports that she has never smoked. She has never used smokeless tobacco. She reports current alcohol use. She reports that she does not use drugs. Rare ETOH. She is a Occupational psychologist for [employer]. Lives with her sister.   Past Medical History:  Diagnosis Date  . Dysmenorrhea     Past Surgical History:  Procedure Laterality Date  . CYST REMOVAL HAND    . WISDOM TOOTH EXTRACTION      Current Outpatient Medications  Medication Sig Dispense Refill  . cetirizine (ZYRTEC) 10 MG tablet Take 2 tablets by mouth daily.    . Multiple Vitamin (MULTIVITAMIN) capsule Take 1 capsule by mouth daily.    Marland Kitchen PREVIDENT 5000 ENAMEL PROTECT 1.1-5 % PSTE USE 3-4 TIMES A WEEK AT NIGHT BEFORE BEDTIME    . Probiotic Product (PROBIOTIC PO) Take by mouth.    . ulipristal acetate (ELLA) 30 MG tablet Take 1 tablet (30 mg total) by mouth once for 1 dose. 1 tablet 0   No current facility-administered medications for this visit.  Festus Holts just prescribed today  Family History  Problem Relation Age of Onset  . Breast cancer Paternal Aunt     Review of Systems  Genitourinary: Positive for vaginal bleeding.  All other systems  reviewed and are negative.   Exam:   BP 110/60   Pulse (!) 104   Temp (!) 97.5 F (36.4 C)   Ht 5\' 1"  (1.549 m)   Wt 155 lb 6.4 oz (70.5 kg)   LMP 04/10/2020   SpO2 98%   BMI 29.36 kg/m   Weight change: @WEIGHTCHANGE @ Height:   Height: 5\' 1"  (154.9 cm)  Ht Readings from Last 3 Encounters:  04/14/20 5\' 1"  (1.549 m)  03/12/20 5' (1.524 m)  04/10/19 5' 0.75" (1.543 m)    General appearance: alert, cooperative and appears stated age Head: Normocephalic, without obvious abnormality, atraumatic Neck: no adenopathy, supple, symmetrical, trachea midline and thyroid normal to inspection and palpation Lungs: clear to auscultation bilaterally Cardiovascular: regular rate and rhythm Breasts: normal appearance, no masses or tenderness Abdomen: soft, non-tender; non distended,  no masses,  no organomegaly Extremities: extremities normal, atraumatic, no cyanosis or edema Skin: Skin color, texture, turgor normal. No rashes or lesions Lymph nodes: Cervical, supraclavicular, and axillary nodes normal. No abnormal inguinal nodes palpated Neurologic: Grossly normal   Pelvic: External genitalia:  no lesions              Urethra:  normal appearing urethra with no masses, tenderness or lesions              Bartholins and Skenes: normal  Vagina: normal appearing vagina with normal color and discharge, no lesions              Cervix: no lesions               Bimanual Exam:  Uterus:  normal size, contour, position, consistency, mobility, non-tender and anteverted              Adnexa: no mass, fullness, tenderness               Rectovaginal: Confirms               Anus:  normal sphincter tone, no lesions  Carolynn Serve chaperoned for the exam.  A:  Well Woman with normal exam  Condoms for contraception  P:   No pap this year  STD testing is up to date  Screening labs  Discussed breast self exam  Discussed calcium and vit D intake  Samson Frederic for emergency contraception

## 2020-04-14 ENCOUNTER — Encounter: Payer: Self-pay | Admitting: Obstetrics and Gynecology

## 2020-04-14 ENCOUNTER — Other Ambulatory Visit: Payer: Self-pay

## 2020-04-14 ENCOUNTER — Ambulatory Visit (INDEPENDENT_AMBULATORY_CARE_PROVIDER_SITE_OTHER): Payer: BC Managed Care – PPO | Admitting: Obstetrics and Gynecology

## 2020-04-14 VITALS — BP 110/60 | HR 104 | Temp 97.5°F | Ht 61.0 in | Wt 155.4 lb

## 2020-04-14 DIAGNOSIS — Z01419 Encounter for gynecological examination (general) (routine) without abnormal findings: Secondary | ICD-10-CM

## 2020-04-14 DIAGNOSIS — S82899A Other fracture of unspecified lower leg, initial encounter for closed fracture: Secondary | ICD-10-CM | POA: Insufficient documentation

## 2020-04-14 DIAGNOSIS — Z0000 Encounter for general adult medical examination without abnormal findings: Secondary | ICD-10-CM | POA: Diagnosis not present

## 2020-04-14 DIAGNOSIS — R0602 Shortness of breath: Secondary | ICD-10-CM | POA: Insufficient documentation

## 2020-04-14 MED ORDER — ELLA 30 MG PO TABS: 1.0000 | ORAL_TABLET | Freq: Once | ORAL | 0 refills | Status: AC

## 2020-04-14 NOTE — Patient Instructions (Signed)
EXERCISE AND DIET:  We recommended that you start or continue a regular exercise program for good health. Regular exercise means any activity that makes your heart beat faster and makes you sweat.  We recommend exercising at least 30 minutes per day at least 3 days a week, preferably 4 or 5.  We also recommend a diet low in fat and sugar.  Inactivity, poor dietary choices and obesity can cause diabetes, heart attack, stroke, and kidney damage, among others.    ALCOHOL AND SMOKING:  Women should limit their alcohol intake to no more than 7 drinks/beers/glasses of wine (combined, not each!) per week. Moderation of alcohol intake to this level decreases your risk of breast cancer and liver damage. And of course, no recreational drugs are part of a healthy lifestyle.  And absolutely no smoking or even second hand smoke. Most people know smoking can cause heart and lung diseases, but did you know it also contributes to weakening of your bones? Aging of your skin?  Yellowing of your teeth and nails?  CALCIUM AND VITAMIN D:  Adequate intake of calcium and Vitamin D are recommended.  The recommendations for exact amounts of these supplements seem to change often, but generally speaking 1,000 mg of calcium (between diet and supplement) and 800 units of Vitamin D per day seems prudent. Certain women may benefit from higher intake of Vitamin D.  If you are among these women, your doctor will have told you during your visit.    PAP SMEARS:  Pap smears, to check for cervical cancer or precancers,  have traditionally been done yearly, although recent scientific advances have shown that most women can have pap smears less often.  However, every woman still should have a physical exam from her gynecologist every year. It will include a breast check, inspection of the vulva and vagina to check for abnormal growths or skin changes, a visual exam of the cervix, and then an exam to evaluate the size and shape of the uterus and  ovaries.  And after 27 years of age, a rectal exam is indicated to check for rectal cancers. We will also provide age appropriate advice regarding health maintenance, like when you should have certain vaccines, screening for sexually transmitted diseases, bone density testing, colonoscopy, mammograms, etc.   MAMMOGRAMS:  All women over 40 years old should have a yearly mammogram. Many facilities now offer a "3D" mammogram, which may cost around $50 extra out of pocket. If possible,  we recommend you accept the option to have the 3D mammogram performed.  It both reduces the number of women who will be called back for extra views which then turn out to be normal, and it is better than the routine mammogram at detecting truly abnormal areas.    COLON CANCER SCREENING: Now recommend starting at age 45. At this time colonoscopy is not covered for routine screening until 50. There are take home tests that can be done between 45-49.   COLONOSCOPY:  Colonoscopy to screen for colon cancer is recommended for all women at age 50.  We know, you hate the idea of the prep.  We agree, BUT, having colon cancer and not knowing it is worse!!  Colon cancer so often starts as a polyp that can be seen and removed at colonscopy, which can quite literally save your life!  And if your first colonoscopy is normal and you have no family history of colon cancer, most women don't have to have it again for   10 years.  Once every ten years, you can do something that may end up saving your life, right?  We will be happy to help you get it scheduled when you are ready.  Be sure to check your insurance coverage so you understand how much it will cost.  It may be covered as a preventative service at no cost, but you should check your particular policy.      Breast Self-Awareness Breast self-awareness means being familiar with how your breasts look and feel. It involves checking your breasts regularly and reporting any changes to your  health care provider. Practicing breast self-awareness is important. A change in your breasts can be a sign of a serious medical problem. Being familiar with how your breasts look and feel allows you to find any problems early, when treatment is more likely to be successful. All women should practice breast self-awareness, including women who have had breast implants. How to do a breast self-exam One way to learn what is normal for your breasts and whether your breasts are changing is to do a breast self-exam. To do a breast self-exam: Look for Changes  1. Remove all the clothing above your waist. 2. Stand in front of a mirror in a room with good lighting. 3. Put your hands on your hips. 4. Push your hands firmly downward. 5. Compare your breasts in the mirror. Look for differences between them (asymmetry), such as: ? Differences in shape. ? Differences in size. ? Puckers, dips, and bumps in one breast and not the other. 6. Look at each breast for changes in your skin, such as: ? Redness. ? Scaly areas. 7. Look for changes in your nipples, such as: ? Discharge. ? Bleeding. ? Dimpling. ? Redness. ? A change in position. Feel for Changes Carefully feel your breasts for lumps and changes. It is best to do this while lying on your back on the floor and again while sitting or standing in the shower or tub with soapy water on your skin. Feel each breast in the following way:  Place the arm on the side of the breast you are examining above your head.  Feel your breast with the other hand.  Start in the nipple area and make  inch (2 cm) overlapping circles to feel your breast. Use the pads of your three middle fingers to do this. Apply light pressure, then medium pressure, then firm pressure. The light pressure will allow you to feel the tissue closest to the skin. The medium pressure will allow you to feel the tissue that is a little deeper. The firm pressure will allow you to feel the tissue  close to the ribs.  Continue the overlapping circles, moving downward over the breast until you feel your ribs below your breast.  Move one finger-width toward the center of the body. Continue to use the  inch (2 cm) overlapping circles to feel your breast as you move slowly up toward your collarbone.  Continue the up and down exam using all three pressures until you reach your armpit.  Write Down What You Find  Write down what is normal for each breast and any changes that you find. Keep a written record with breast changes or normal findings for each breast. By writing this information down, you do not need to depend only on memory for size, tenderness, or location. Write down where you are in your menstrual cycle, if you are still menstruating. If you are having trouble noticing differences   in your breasts, do not get discouraged. With time you will become more familiar with the variations in your breasts and more comfortable with the exam. How often should I examine my breasts? Examine your breasts every month. If you are breastfeeding, the best time to examine your breasts is after a feeding or after using a breast pump. If you menstruate, the best time to examine your breasts is 5-7 days after your period is over. During your period, your breasts are lumpier, and it may be more difficult to notice changes. When should I see my health care provider? See your health care provider if you notice:  A change in shape or size of your breasts or nipples.  A change in the skin of your breast or nipples, such as a reddened or scaly area.  Unusual discharge from your nipples.  A lump or thick area that was not there before.  Pain in your breasts.  Anything that concerns you.  Emergency Contraception Emergency contraception refers to birth control methods that prevent pregnancy after unprotected sex. Emergency contraception may be recommended:  When a condom breaks.  After a sexual  assault.  If you forgot to take your birth control pill.  If you used inadequate contraception during sex. Emergency contraception is most effective if used as soon as possible after sex. It is important to note that it does not protect against STIs (sexually transmitted infections). Do not use emergency contraception as your only form of birth control. Types of emergency contraceptives Emergency contraception must be used within 5 days after having unprotected sex. The following types of emergency contraception are available:  Hormonal pills that work by preventing the ovaries from releasing an egg (ovulation) and preventing fertilization of an egg. There are two types of hormonal pills: ? A pill that contains high doses of estrogen and progesterone. ? A pill that contains only progesterone. This may be a single pill or two pills taken 12-24 hours apart.  A non-hormonal pill that works by preventing progesterone from having its normal effect on ovulation and the lining of the uterus (endometrium). A prescription for this medicine is usually required.  A non-hormonal medical device that is inserted into the uterus (copper intrauterine device, IUD). The copper in the IUD causes the sperm to be less able to fertilize the egg. A health care provider must insert the IUD. Most types of emergency contraceptives are available without a prescription or a visit with your health care provider. If you are younger than 17, you may need a prescription. Talk with your pharmacist about your options. Side effects Ask your health care provider about the possible side effects of emergency contraceptives. Side effects may include:  Abdominal pain and cramps.  Nausea and vomiting.  Breast tenderness.  Headache.  Dizziness.  Fatigue.  Irregular bleeding or spotting. If you take emergency contraception while you are pregnant, it will not end your pregnancy or harm your baby. Follow these instructions at  home:  Take over-the-counter and prescription medicines only as told by your health care provider.  Eat something before taking the emergency contraception pills. This can help prevent nausea.  If you feel tired or dizzy, rest until you feel better.  Continue using your normal method of birth control. Contact a health care provider if:  You vomit within 2 hours after taking a pill. You may need to take another pill.  You have a severe headache.  You have bleeding that does not stop.  It has   been 21 days since you took an emergency contraception pill and you have not had a menstrual period. Get help right away if:  You have chest pain.  You have leg pain.  You have numbness or weakness of your arms or legs.  You have slurred speech.  You have visual problems. Summary  Emergency contraceptives prevent pregnancy after unprotected sex.  Emergency contraception will not work if you are already pregnant and will not harm the baby if you are pregnant.  Some emergency contraceptives are available from your local pharmacy without a prescription.  Talk to your health care provider about the type of emergency contraceptives that are best for you. This information is not intended to replace advice given to you by your health care provider. Make sure you discuss any questions you have with your health care provider. Document Revised: 10/19/2017 Document Reviewed: 12/19/2016 Elsevier Patient Education  2020 [employer]  

## 2020-04-15 LAB — CBC
Hematocrit: 43.1 % (ref 34.0–46.6)
Hemoglobin: 13.7 g/dL (ref 11.1–15.9)
MCH: 29.1 pg (ref 26.6–33.0)
MCHC: 31.8 g/dL (ref 31.5–35.7)
MCV: 92 fL (ref 79–97)
Platelets: 264 10*3/uL (ref 150–450)
RBC: 4.71 x10E6/uL (ref 3.77–5.28)
RDW: 12.6 % (ref 11.7–15.4)
WBC: 5.6 10*3/uL (ref 3.4–10.8)

## 2020-04-15 LAB — COMPREHENSIVE METABOLIC PANEL
ALT: 18 IU/L (ref 0–32)
AST: 18 IU/L (ref 0–40)
Albumin/Globulin Ratio: 2 (ref 1.2–2.2)
Albumin: 4.3 g/dL (ref 3.9–5.0)
Alkaline Phosphatase: 90 IU/L (ref 48–121)
BUN/Creatinine Ratio: 13 (ref 9–23)
BUN: 12 mg/dL (ref 6–20)
Bilirubin Total: 0.3 mg/dL (ref 0.0–1.2)
CO2: 22 mmol/L (ref 20–29)
Calcium: 9.5 mg/dL (ref 8.7–10.2)
Chloride: 102 mmol/L (ref 96–106)
Creatinine, Ser: 0.9 mg/dL (ref 0.57–1.00)
GFR calc Af Amer: 102 mL/min/{1.73_m2} (ref >59)
GFR calc non Af Amer: 89 mL/min/{1.73_m2} (ref >59)
Globulin, Total: 2.2 g/dL (ref 1.5–4.5)
Glucose: 91 mg/dL (ref 65–99)
Potassium: 4.6 mmol/L (ref 3.5–5.2)
Sodium: 142 mmol/L (ref 134–144)
Total Protein: 6.5 g/dL (ref 6.0–8.5)

## 2020-04-15 LAB — LIPID PANEL
Chol/HDL Ratio: 3.7 ratio (ref 0.0–4.4)
Cholesterol, Total: 217 mg/dL — ABNORMAL HIGH (ref 100–199)
HDL: 58 mg/dL (ref >39)
LDL Chol Calc (NIH): 144 mg/dL — ABNORMAL HIGH (ref 0–99)
Triglycerides: 84 mg/dL (ref 0–149)
VLDL Cholesterol Cal: 15 mg/dL (ref 5–40)

## 2020-06-25 DIAGNOSIS — B373 Candidiasis of vulva and vagina: Secondary | ICD-10-CM | POA: Diagnosis not present

## 2020-06-28 ENCOUNTER — Ambulatory Visit: Payer: BC Managed Care – PPO | Admitting: Obstetrics and Gynecology

## 2020-06-28 ENCOUNTER — Telehealth: Payer: Self-pay | Admitting: Obstetrics and Gynecology

## 2020-06-28 NOTE — Telephone Encounter (Signed)
Spoke with pt. Pt needing to reschedule appt due to being late to appt today. Pt rescheduled with Dr Oscar La for 8/10 at 130 pm and pt verbalized understanding of date and time of appt and arrival time.  Encounter closed

## 2020-06-28 NOTE — Telephone Encounter (Signed)
Patient was late for problem visit appointment and would like to reschedule. Sending to triage to assist with scheduling.

## 2020-06-28 NOTE — Progress Notes (Deleted)
GYNECOLOGY  VISIT   HPI: 29 y.o.   Single Black or African American Not Hispanic or Latino  female   G0P0000 with No LMP recorded.   here for     GYNECOLOGIC HISTORY: No LMP recorded. Contraception:*** Menopausal hormone therapy: ***        OB History    Gravida  0   Para  0   Term  0   Preterm  0   AB  0   Living  0     SAB  0   TAB  0   Ectopic  0   Multiple  0   Live Births  0              Patient Active Problem List   Diagnosis Date Noted  . Shortness of breath 04/14/2020  . Fracture, ankle 04/14/2020  . Referred ear pain, bilateral 02/10/2020  . 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 Dysmenorrhea     Past Surgical History:  Procedure Laterality Date  . CYST REMOVAL HAND    . WISDOM TOOTH EXTRACTION      Current Outpatient Medications  Medication Sig Dispense Refill  . cetirizine (ZYRTEC) 10 MG tablet Take 2 tablets by mouth daily.    . Multiple Vitamin (MULTIVITAMIN) capsule Take 1 capsule by mouth daily.    Marland Kitchen PREVIDENT 5000 ENAMEL PROTECT 1.1-5 % PSTE USE 3-4 TIMES A WEEK AT NIGHT BEFORE BEDTIME    . Probiotic Product (PROBIOTIC PO) Take by mouth.     No current facility-administered medications for this visit.     ALLERGIES: Aspirin and Ibuprofen  Family History  Problem Relation Age of Onset  . Breast cancer Paternal Aunt     Social History   Socioeconomic History  . Marital status: Single    Spouse name: Not on file  . Number of children: Not on file  . Years of education: Not on file  . Highest education level: Not on file  Occupational History  . Not on file  Tobacco Use  . Smoking status: Never Smoker  . Smokeless tobacco: Never Used  Vaping Use  . Vaping Use: Never used  Substance and Sexual Activity  . Alcohol use: Yes    Comment: occassionally  . Drug use: Never  . Sexual activity: Yes    Partners: Male    Birth control/protection: None, OCP  Other Topics Concern  . Not on file   Social History Narrative  . Not on file   Social Determinants of Health   Financial Resource Strain:   . Difficulty of Paying Living Expenses:   Food Insecurity:   . Worried About Programme researcher, broadcasting/film/video in the Last Year:   . Barista in the Last Year:   Transportation Needs:   . Freight forwarder (Medical):   Marland Kitchen Lack of Transportation (Non-Medical):   Physical Activity:   . Days of Exercise per Week:   . Minutes of Exercise per Session:   Stress:   . Feeling of Stress :   Social Connections:   . Frequency of Communication with Friends and Family:   . Frequency of Social Gatherings with Friends and Family:   . Attends Religious Services:   . Active Member of Clubs or Organizations:   . Attends Banker Meetings:   Marland Kitchen Marital Status:   Intimate Partner Violence:   . Fear of Current or Ex-Partner:   . Emotionally Abused:   .  Physically Abused:   . Sexually Abused:     ROS  PHYSICAL EXAMINATION:    There were no vitals taken for this visit.    General appearance: alert, cooperative and appears stated age Neck: no adenopathy, supple, symmetrical, trachea midline and thyroid {CHL AMB PHY EX THYROID NORM DEFAULT:[id number]::"normal to inspection and palpation"} Breasts: {Exam; breast:13139::"normal appearance, no masses or tenderness"} Abdomen: soft, non-tender; non distended, no masses,  no organomegaly  Pelvic: External genitalia:  no lesions              Urethra:  normal appearing urethra with no masses, tenderness or lesions              Bartholins and Skenes: normal                 Vagina: normal appearing vagina with normal color and discharge, no lesions              Cervix: {CHL AMB PHY EX CERVIX NORM DEFAULT:[phone]::"no lesions"}              Bimanual Exam:  Uterus:  {CHL AMB PHY EX UTERUS NORM DEFAULT:[phone]::"normal size, contour, position, consistency, mobility, non-tender"}              Adnexa: {CHL AMB PHY EX ADNEXA NO MASS  DEFAULT:[phone]::"no mass, fullness, tenderness"}              Rectovaginal: {yes no:[id number]}.  Confirms.              Anus:  normal sphincter tone, no lesions  Chaperone was present for exam.  ASSESSMENT     PLAN    An After Visit Summary was printed and given to the patient.  *** minutes face to face time of which over 50% was spent in counseling.

## 2020-06-29 ENCOUNTER — Other Ambulatory Visit: Payer: Self-pay

## 2020-06-29 ENCOUNTER — Encounter: Payer: Self-pay | Admitting: Obstetrics and Gynecology

## 2020-06-29 ENCOUNTER — Ambulatory Visit (INDEPENDENT_AMBULATORY_CARE_PROVIDER_SITE_OTHER): Payer: BC Managed Care – PPO | Admitting: Obstetrics and Gynecology

## 2020-06-29 VITALS — BP 110/62 | HR 60 | Ht 60.0 in | Wt 158.0 lb

## 2020-06-29 DIAGNOSIS — N9089 Other specified noninflammatory disorders of vulva and perineum: Secondary | ICD-10-CM

## 2020-06-29 DIAGNOSIS — Z113 Encounter for screening for infections with a predominantly sexual mode of transmission: Secondary | ICD-10-CM

## 2020-06-29 NOTE — Progress Notes (Signed)
GYNECOLOGY  VISIT   HPI: 29 y.o.   Single Black or African American Not Hispanic or Latino  female   G0P0000 with No LMP recorded.   here for complete std testing.  On Saturday she was treated with diflucan via virtual medicine for symptoms of a yeast infection. Discharge is better, minimal itching (improved). She has a tender vulvar lump, thinks it's a hair follicle.   GYNECOLOGIC HISTORY: No LMP recorded. Contraception:condoms Menopausal hormone therapy: NA        OB History    Gravida  0   Para  0   Term  0   Preterm  0   AB  0   Living  0     SAB  0   TAB  0   Ectopic  0   Multiple  0   Live Births  0              Patient Active Problem List   Diagnosis Date Noted  . Shortness of breath 04/14/2020  . Fracture, ankle 04/14/2020  . Referred ear pain, bilateral 02/10/2020  . 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 Dysmenorrhea     Past Surgical History:  Procedure Laterality Date  . CYST REMOVAL HAND    . WISDOM TOOTH EXTRACTION      Current Outpatient Medications  Medication Sig Dispense Refill  . cetirizine (ZYRTEC) 10 MG tablet Take 2 tablets by mouth daily.    . Multiple Vitamin (MULTIVITAMIN) capsule Take 1 capsule by mouth daily.    Marland Kitchen PREVIDENT 5000 ENAMEL PROTECT 1.1-5 % PSTE USE 3-4 TIMES A WEEK AT NIGHT BEFORE BEDTIME    . Probiotic Product (PROBIOTIC PO) Take by mouth.     No current facility-administered medications for this visit.     ALLERGIES: Aspirin and Ibuprofen  Family History  Problem Relation Age of Onset  . Breast cancer Paternal Aunt     Social History   Socioeconomic History  . Marital status: Single    Spouse name: Not on file  . Number of children: Not on file  . Years of education: Not on file  . Highest education level: Not on file  Occupational History  . Not on file  Tobacco Use  . Smoking status: Never Smoker  . Smokeless tobacco: Never Used  Vaping Use  . Vaping Use:  Never used  Substance and Sexual Activity  . Alcohol use: Yes    Comment: occassionally  . Drug use: Never  . Sexual activity: Yes    Partners: Male    Birth control/protection: None, OCP  Other Topics Concern  . Not on file  Social History Narrative  . Not on file   Social Determinants of Health   Financial Resource Strain:   . Difficulty of Paying Living Expenses:   Food Insecurity:   . Worried About Programme researcher, broadcasting/film/video in the Last Year:   . Barista in the Last Year:   Transportation Needs:   . Freight forwarder (Medical):   Marland Kitchen Lack of Transportation (Non-Medical):   Physical Activity:   . Days of Exercise per Week:   . Minutes of Exercise per Session:   Stress:   . Feeling of Stress :   Social Connections:   . Frequency of Communication with Friends and Family:   . Frequency of Social Gatherings with Friends and Family:   . Attends Religious Services:   . Active Member of  Clubs or Organizations:   . Attends Banker Meetings:   Marland Kitchen Marital Status:   Intimate Partner Violence:   . Fear of Current or Ex-Partner:   . Emotionally Abused:   Marland Kitchen Physically Abused:   . Sexually Abused:     Review of Systems  All other systems reviewed and are negative.   PHYSICAL EXAMINATION:    BP 110/62   Pulse 60   Ht 5' (1.524 m)   Wt 158 lb (71.7 kg)   SpO2 100%   BMI 30.86 kg/m     General appearance: alert, cooperative and appears stated age  Pelvic: External genitalia:  On the right upper labia majora/mons there is a tender raised lump with focal ulceration.               Urethra:  normal appearing urethra with no masses, tenderness or lesions              Bartholins and Skenes: normal                 Vagina: normal appearing vagina with normal color and discharge, no lesions, +blood              Cervix: no lesions                Chaperone Shanon Loney Hering was present for exam.  ASSESSMENT Screening STD testing Vulvar lesion, suspect boil, small  ulceration    PLAN Full STD testing done, including HSV serology (counseled as to possibility of false +) Check for vulvar hsv  Discussed warm compresses and hot soaks Call if she develops any further lesions.

## 2020-06-29 NOTE — Patient Instructions (Signed)
Safe Sex Practicing safe sex means taking steps before and during sex to reduce your risk of:  Getting an STI (sexually transmitted infection).  Giving your partner an STI.  Unwanted or unplanned pregnancy. How can I practice safe sex?     Ways you can practice safe sex  Limit your sexual partners to only one partner who is having sex with only you.  Avoid using alcohol and drugs before having sex. Alcohol and drugs can affect your judgment.  Before having sex with a new partner: ? Talk to your partner about past partners, past STIs, and drug use. ? Get screened for STIs and discuss the results with your partner. Ask your partner to get screened, too.  Check your body regularly for sores, blisters, rashes, or unusual discharge. If you notice any of these problems, visit your health care provider.  Avoid sexual contact if you have symptoms of an infection or you are being treated for an STI.  While having sex, use a condom. Make sure to: ? Use a condom every time you have vaginal, oral, or anal sex. Both females and males should wear condoms during oral sex. ? Keep condoms in place from the beginning to the end of sexual activity. ? Use a latex condom, if possible. Latex condoms offer the best protection. ? Use only water-based lubricants with a condom. Using petroleum-based lubricants or oils will weaken the condom and increase the chance that it will break. Ways your health care provider can help you practice safe sex  See your health care provider for regular screenings, exams, and tests for STIs.  Talk with your health care provider about what kind of birth control (contraception) is best for you.  Get vaccinated against hepatitis B and human papillomavirus (HPV).  If you are at risk of being infected with HIV (human immunodeficiency virus), talk with your health care provider about taking a prescription medicine to prevent HIV infection. You are at risk for HIV if  you: ? Are a man who has sex with other men. ? Are sexually active with more than one partner. ? Take drugs by injection. ? Have a sex partner who has HIV. ? Have unprotected sex. ? Have sex with someone who has sex with both men and women. ? Have had an STI. Follow these instructions at home:  Take over-the-counter and prescription medicines as told by your health care provider.  Keep all follow-up visits as told by your health care provider. This is important. Where to find more information  Centers for Disease Control and Prevention: https://www.cdc.gov/std/prevention/default.htm  Planned Parenthood: https://www.plannedparenthood.org/  Office on Women's Health: https://www.womenshealth.gov/a-z-topics/sexually-transmitted-infections Summary  Practicing safe sex means taking steps before and during sex to reduce your risk of STIs, giving your partner STIs, and having an unwanted or unplanned pregnancy.  Before having sex with a new partner, talk to your partner about past partners, past STIs, and drug use.  Use a condom every time you have vaginal, oral, or anal sex. Both females and males should wear condoms during oral sex.  Check your body regularly for sores, blisters, rashes, or unusual discharge. If you notice any of these problems, visit your health care provider.  See your health care provider for regular screenings, exams, and tests for STIs. This information is not intended to replace advice given to you by your health care provider. Make sure you discuss any questions you have with your health care provider. Document Revised: 02/28/2019 Document Reviewed: 08/19/2018 Elsevier Patient Education    2020 [employer] Skin Abscess  A skin abscess is an infected area on or under your skin that contains a collection of pus and other material. An abscess may also be called a furuncle, carbuncle, or boil. An abscess can occur in or on almost any part of your body. Some  abscesses break open (rupture) on their own. Most continue to get worse unless they are treated. The infection can spread deeper into the body and eventually into your blood, which can make you feel ill. Treatment usually involves draining the abscess. What are the causes? An abscess occurs when germs, like bacteria, pass through your skin and cause an infection. This may be caused by:  A scrape or cut on your skin.  A puncture wound through your skin, including a needle injection or insect bite.  Blocked oil or sweat glands.  Blocked and infected hair follicles.  A cyst that forms beneath your skin (sebaceous cyst) and becomes infected. What increases the risk? This condition is more likely to develop in people who:  Have a weak body defense system (immune system).  Have diabetes.  Have dry and irritated skin.  Get frequent injections or use illegal IV drugs.  Have a foreign body in a wound, such as a splinter.  Have problems with their lymph system or veins. What are the signs or symptoms? Symptoms of this condition include:  A painful, firm bump under the skin.  A bump with pus at the top. This may break through the skin and drain. Other symptoms include:  Redness surrounding the abscess site.  Warmth.  Swelling of the lymph nodes (glands) near the abscess.  Tenderness.  A sore on the skin. How is this diagnosed? This condition may be diagnosed based on:  A physical exam.  Your medical history.  A sample of pus. This may be used to find out what is causing the infection.  Blood tests.  Imaging tests, such as an ultrasound, CT scan, or MRI. How is this treated? A small abscess that drains on its own may not need treatment. Treatment for larger abscesses may include:  Moist heat or heat pack applied to the area several times a day.  A procedure to drain the abscess (incision and drainage).  Antibiotic medicines. For a severe abscess, you may first get  antibiotics through an IV and then change to antibiotics by mouth. Follow these instructions at home: Medicines   Take over-the-counter and prescription medicines only as told by your health care provider.  If you were prescribed an antibiotic medicine, take it as told by your health care provider. Do not stop taking the antibiotic even if you start to feel better. Abscess care   If you have an abscess that has not drained, apply heat to the affected area. Use the heat source that your health care provider recommends, such as a moist heat pack or a heating pad. ? Place a towel between your skin and the heat source. ? Leave the heat on for 20-30 minutes. ? Remove the heat if your skin turns bright red. This is especially important if you are unable to feel pain, heat, or cold. You may have a greater risk of getting burned.  Follow instructions from your health care provider about how to take care of your abscess. Make sure you: ? Cover the abscess with a bandage (dressing). ? Change your dressing or gauze as told by your health care provider. ? Wash your hands with soap and water  before you change the dressing or gauze. If soap and water are not available, use hand sanitizer.  Check your abscess every day for signs of a worsening infection. Check for: ? More redness, swelling, or pain. ? More fluid or blood. ? Warmth. ? More pus or a bad smell. General instructions  To avoid spreading the infection: ? Do not share personal care items, towels, or hot tubs with others. ? Avoid making skin contact with other people.  Keep all follow-up visits as told by your health care provider. This is important. Contact a health care provider if you have:  More redness, swelling, or pain around your abscess.  More fluid or blood coming from your abscess.  Warm skin around your abscess.  More pus or a bad smell coming from your abscess.  A fever.  Muscle aches.  Chills or a general ill  feeling. Get help right away if you:  Have severe pain.  See red streaks on your skin spreading away from the abscess. Summary  A skin abscess is an infected area on or under your skin that contains a collection of pus and other material.  A small abscess that drains on its own may not need treatment.  Treatment for larger abscesses may include having a procedure to drain the abscess and taking an antibiotic. This information is not intended to replace advice given to you by your health care provider. Make sure you discuss any questions you have with your health care provider. Document Revised: 02/27/2019 Document Reviewed: 12/20/2017 Elsevier Patient Education  2020 Else[employer] Inc[employer]  

## 2020-06-30 LAB — HSV(HERPES SIMPLEX VRS) I + II AB-IGG
HSV 1 Glycoprotein G Ab, IgG: <0.91 index (ref 0.00–0.90)
HSV 2 IgG, Type Spec: <0.91 index (ref 0.00–0.90)

## 2020-06-30 LAB — HEP, RPR, HIV PANEL
HIV Screen 4th Generation wRfx: NONREACTIVE
Hepatitis B Surface Ag: NEGATIVE
RPR Ser Ql: NONREACTIVE

## 2020-06-30 LAB — CHLAMYDIA/GONOCOCCUS/TRICHOMONAS, NAA
Chlamydia by NAA: NEGATIVE
Gonococcus by NAA: NEGATIVE
Trich vag by NAA: NEGATIVE

## 2020-06-30 LAB — HEPATITIS C ANTIBODY: Hep C Virus Ab: <0.1 s/co ratio (ref 0.0–0.9)

## 2020-07-03 LAB — HSV NAA
HSV 1 NAA: NEGATIVE
HSV 2 NAA: NEGATIVE

## 2020-07-25 DIAGNOSIS — H66002 Acute suppurative otitis media without spontaneous rupture of ear drum, left ear: Secondary | ICD-10-CM | POA: Diagnosis not present

## 2020-07-25 DIAGNOSIS — H9202 Otalgia, left ear: Secondary | ICD-10-CM | POA: Diagnosis not present

## 2020-07-25 DIAGNOSIS — J029 Acute pharyngitis, unspecified: Secondary | ICD-10-CM | POA: Diagnosis not present

## 2020-09-07 DIAGNOSIS — B373 Candidiasis of vulva and vagina: Secondary | ICD-10-CM | POA: Diagnosis not present

## 2020-11-17 DIAGNOSIS — U071 COVID-19: Secondary | ICD-10-CM | POA: Diagnosis not present

## 2020-12-27 DIAGNOSIS — Z9109 Other allergy status, other than to drugs and biological substances: Secondary | ICD-10-CM | POA: Insufficient documentation

## 2020-12-27 DIAGNOSIS — Z0000 Encounter for general adult medical examination without abnormal findings: Secondary | ICD-10-CM | POA: Diagnosis not present

## 2020-12-27 DIAGNOSIS — Z131 Encounter for screening for diabetes mellitus: Secondary | ICD-10-CM | POA: Diagnosis not present

## 2020-12-27 DIAGNOSIS — Z113 Encounter for screening for infections with a predominantly sexual mode of transmission: Secondary | ICD-10-CM | POA: Diagnosis not present

## 2020-12-27 DIAGNOSIS — Z13220 Encounter for screening for lipoid disorders: Secondary | ICD-10-CM | POA: Diagnosis not present

## 2021-01-17 ENCOUNTER — Encounter: Payer: Self-pay | Admitting: Obstetrics and Gynecology

## 2021-01-17 ENCOUNTER — Ambulatory Visit (INDEPENDENT_AMBULATORY_CARE_PROVIDER_SITE_OTHER): Payer: BC Managed Care – PPO | Admitting: Obstetrics and Gynecology

## 2021-01-17 ENCOUNTER — Other Ambulatory Visit: Payer: Self-pay

## 2021-01-17 VITALS — BP 118/68 | HR 70 | Ht 60.0 in | Wt 161.0 lb

## 2021-01-17 DIAGNOSIS — Z3009 Encounter for other general counseling and advice on contraception: Secondary | ICD-10-CM

## 2021-01-17 DIAGNOSIS — G43009 Migraine without aura, not intractable, without status migrainosus: Secondary | ICD-10-CM | POA: Insufficient documentation

## 2021-01-17 MED ORDER — NORETHIN ACE-ETH ESTRAD-FE 1-20 MG-MCG PO TABS: 1.0000 | ORAL_TABLET | Freq: Every day | ORAL | 1 refills | Status: DC

## 2021-01-17 NOTE — Patient Instructions (Signed)
Oral Contraception Information Oral contraceptive pills (OCPs) are medicines taken by mouth to prevent pregnancy. They work by:  Preventing the ovaries from releasing eggs.  Thickening mucus in the lower part of the uterus (cervix). This prevents sperm from entering the uterus.  Thinning the lining of the uterus (endometrium). This prevents a fertilized egg from attaching to the endometrium. OCPs are highly effective when taken exactly as prescribed. However, OCPs do not prevent STIs (sexually transmitted infections). Using condoms while on an OCP can help prevent STIs. What happens before starting OCPs? Before you start taking OCPs:  You may have a physical exam, blood test, and Pap test.  Your health care provider will make sure you are a good candidate for oral contraception. OCPs are not a good option for certain women, such as: ? Women who smoke and are older than age 35. ? Women who have or have had certain conditions, such as:  A history of high blood pressure.  Deep vein thrombosis.  Pulmonary embolism.  Stroke.  Cardiovascular disease.  Peripheral vascular disease. Ask your health care provider about the possible side effects of the OCP you may be prescribed. Be aware that it can take 2-3 months for your body to adjust to changes in hormone levels. Types of oral contraception Birth control pills contain the hormones estrogen and progestin (synthetic progesterone) or progestin only. The combination pill This type of pill contains estrogen and progestin hormones.  Conventional contraception pills come in packs of 21 or 28 pills. ? Some packs with 28-day pills contain estrogen and progestin for the first 21-24 days. Hormone-free tablets, called placebos, are taken for the final 4-7 days. You should have menstrual bleeding during the time you take the placebos. ? In packs with 21 tablets, you take no pills for 7 days. Menstrual bleeding occurs during these days. (Some people  prefer taking a pill for 28 days to help establish a routine).  Extended-interval contraception pills come in packs of 91 pills. The first 84 tablets have both estrogen and progestin. The last 7 pills are placebos. Menstrual bleeding occurs during the placebo days. With this schedule, menstrual bleeding happens once every 3 months.  Continuous contraception pills come in packs of 28 pills. All pills in the pack contain estrogen and progestin. With this schedule, regular menstrual bleeding does not happen, but there may be spotting or irregular bleeding. Progestin-only pills This type of pill is often called the mini-pill and contains the progestin hormone only. It comes in packs of 28 pills. In some packs, the last 4 pills are placebos. The pill must be taken at the same time every day. This is very important to prevent pregnancy. Menstrual bleeding may not be regular or predictable.   What are the advantages? Oral contraception provides reliable and continuous contraception if taken as directed. It may treat or decrease symptoms of:  Menstrual period cramps.  Irregular menstrual cycle or bleeding.  Heavy menstrual flow.  Abnormal uterine bleeding.  Acne, depending on the type of pill.  Polycystic ovarian syndrome (POS).  Endometriosis.  Iron deficiency anemia.  Premenstrual symptoms, including severe irritability, depression, or anxiety. It also may:  Reduce the risk of endometrial and ovarian cancer.  Be used as emergency contraception.  Prevent ectopic pregnancies and infections of the fallopian tubes. What can make OCPs less effective? OCPs may be less effective if:  You forget to take the pill every day. For progestin-only pills, it is especially important to take the pill at the   same time each day. Even taking it 3 hours late can increase the risk of pregnancy.  You have a stomach or intestinal disease that reduces your body's ability to absorb the pill.  You take OCPs  with other medicines that make OCPs less effective, such as antibiotics, certain HIV medicines, and some seizure medicines.  You take expired OCPs.  You forget to restart the pill after 7 days of not taking it. This refers to the packs of 21 pills. What are the side effects and risks? OCPs can sometimes cause side effects, such as:  Headache.  Depression.  Trouble sleeping.  Nausea and vomiting.  Breast tenderness.  Irregular bleeding or spotting during the first several months.  Bloating or fluid retention.  Increase in blood pressure. Combination pills may slightly increase the risk of:  Blood clots.  Heart attack.  Stroke. Follow these instructions at home: Follow instructions from your health care provider about how to start taking your first cycle of OCPs. Depending on when you start the pill, you may need to use a backup form of birth control, such as condoms, during the first week. Make sure you know what steps to take if you forget to take the pill. Summary  Oral contraceptive pills (OCPs) are medicines taken by mouth to prevent pregnancy. They are highly effective when taken exactly as prescribed.  OCPs contain a combination of the hormones estrogen and progestin (synthetic progesterone) or progestin only.  Before you start taking the pill, you may have a physical exam, blood test, and Pap test. Your health care provider will make sure you are a good candidate for oral contraception.  The combination pill may come in a 21-day pack, a 28-day pack, or a 91-day pack. Progestin-only pills come in packs of 28 pills.  OCPs can sometimes cause side effects, such as headache, nausea, breast tenderness, or irregular bleeding. This information is not intended to replace advice given to you by your health care provider. Make sure you discuss any questions you have with your health care provider. Document Revised: 08/06/2020 Document Reviewed: 07/15/2020 Elsevier Patient  Education  2021 [employer]  

## 2021-01-17 NOTE — Progress Notes (Signed)
GYNECOLOGY  VISIT   HPI: 30 y.o.   Single Black or African American Not Hispanic or Latino  female   G0P0000 with Patient's last menstrual period was 01/12/2021.   here to discuss birth control options  Cycles are q 25-28 days. Bleeds for 3-4 days. Heavy for 2-3 days. Uses overnight pads, saturates them in 3-4 hours. Bad cramps.  Same partner x 2 years, currently using condoms.   She was previously on OCP's and did well.   GYNECOLOGIC HISTORY: Patient's last menstrual period was 01/12/2021. Contraception:Condoms  Menopausal hormone therapy: none         OB History    Gravida  0   Para  0   Term  0   Preterm  0   AB  0   Living  0     SAB  0   IAB  0   Ectopic  0   Multiple  0   Live Births  0              Patient Active Problem List   Diagnosis Date Noted  . Migraine without aura   . Shortness of breath 04/14/2020  . Fracture, ankle 04/14/2020  . Referred ear pain, bilateral 02/10/2020  . 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 Dysmenorrhea   . Migraine without aura     Past Surgical History:  Procedure Laterality Date  . CYST REMOVAL HAND    . WISDOM TOOTH EXTRACTION      Current Outpatient Medications  Medication Sig Dispense Refill  . cetirizine (ZYRTEC) 10 MG tablet Take 2 tablets by mouth daily.    . Multiple Vitamin (MULTIVITAMIN) capsule Take 1 capsule by mouth daily.    Marland Kitchen PREVIDENT 5000 ENAMEL PROTECT 1.1-5 % PSTE USE 3-4 TIMES A WEEK AT NIGHT BEFORE BEDTIME    . Probiotic Product (PROBIOTIC PO) Take by mouth.     No current facility-administered medications for this visit.     ALLERGIES: Aspirin and Ibuprofen  Family History  Problem Relation Age of Onset  . Breast cancer Paternal Aunt     Social History   Socioeconomic History  . Marital status: Single    Spouse name: Not on file  . Number of children: Not on file  . Years of education: Not on file  . Highest education level: Not on file   Occupational History  . Not on file  Tobacco Use  . Smoking status: Never Smoker  . Smokeless tobacco: Never Used  Vaping Use  . Vaping Use: Never used  Substance and Sexual Activity  . Alcohol use: Yes    Comment: occassionally  . Drug use: Never  . Sexual activity: Yes    Partners: Male    Birth control/protection: None, OCP  Other Topics Concern  . Not on file  Social History Narrative  . Not on file   Social Determinants of Health   Financial Resource Strain: Not on file  Food Insecurity: Not on file  Transportation Needs: Not on file  Physical Activity: Not on file  Stress: Not on file  Social Connections: Not on file  Intimate Partner Violence: Not on file    ROS  PHYSICAL EXAMINATION:    BP 118/68   Pulse 70   Ht 5' (1.524 m)   Wt 161 lb (73 kg)   LMP 01/12/2021   SpO2 98%   BMI 31.44 kg/m     General appearance: alert, cooperative and appears  stated age   1. General counseling and advice on female contraception Discussed all of her options for contraception. She is interested in OCP's. No contraindications. Risks reviewed and information given - norethindrone-ethinyl estradiol (LOESTRIN FE) 1-20 MG-MCG tablet; Take 1 tablet by mouth daily.  Dispense: 84 tablet; Refill: 1

## 2021-03-26 DIAGNOSIS — J029 Acute pharyngitis, unspecified: Secondary | ICD-10-CM | POA: Diagnosis not present

## 2021-03-26 DIAGNOSIS — R0982 Postnasal drip: Secondary | ICD-10-CM | POA: Diagnosis not present

## 2021-03-26 DIAGNOSIS — B349 Viral infection, unspecified: Secondary | ICD-10-CM | POA: Diagnosis not present

## 2021-03-26 DIAGNOSIS — R0981 Nasal congestion: Secondary | ICD-10-CM | POA: Diagnosis not present

## 2021-04-19 NOTE — Progress Notes (Deleted)
30 y.o. G0P0000 Single Black or African American Not Hispanic or Latino female here for annual exam.      No LMP recorded.          Sexually active: {yes no:314532}  The current method of family planning is {contraception:315051}.    Exercising: {yes no:314532}  {types:19826} Smoker:  {YES NO:22349}  Health Maintenance: Pap:04/10/19 ASCUS HPV neg   History of abnormal Pap:  yes MMG:  None  BMD:   None  Colonoscopy: none TDaP:  04/10/19  Gardasil: complete per patient    reports that she has never smoked. She has never used smokeless tobacco. She reports current alcohol use. She reports that she does not use drugs.  Past Medical History:  Diagnosis Date  . Dysmenorrhea   . Migraine without aura     Past Surgical History:  Procedure Laterality Date  . CYST REMOVAL HAND    . WISDOM TOOTH EXTRACTION      Current Outpatient Medications  Medication Sig Dispense Refill  . cetirizine (ZYRTEC) 10 MG tablet Take 2 tablets by mouth daily.    . Multiple Vitamin (MULTIVITAMIN) capsule Take 1 capsule by mouth daily.    . norethindrone-ethinyl estradiol (LOESTRIN FE) 1-20 MG-MCG tablet Take 1 tablet by mouth daily. 84 tablet 1  . PREVIDENT 5000 ENAMEL PROTECT 1.1-5 % PSTE USE 3-4 TIMES A WEEK AT NIGHT BEFORE BEDTIME    . Probiotic Product (PROBIOTIC PO) Take by mouth.     No current facility-administered medications for this visit.    Family History  Problem Relation Age of Onset  . Breast cancer Paternal Aunt     Review of Systems  Exam:   There were no vitals taken for this visit.  Weight change: @WEIGHTCHANGE @ Height:      Ht Readings from Last 3 Encounters:  01/17/21 5' (1.524 m)  06/29/20 5' (1.524 m)  04/14/20 5\' 1"  (1.549 m)    General appearance: alert, cooperative and appears stated age Head: Normocephalic, without obvious abnormality, atraumatic Neck: no adenopathy, supple, symmetrical, trachea midline and thyroid {CHL AMB PHY EX THYROID NORM  DEFAULT:[phone]::"normal to inspection and palpation"} Lungs: clear to auscultation bilaterally Cardiovascular: regular rate and rhythm Breasts: {Exam; breast:13139::"normal appearance, no masses or tenderness"} Abdomen: soft, non-tender; non distended,  no masses,  no organomegaly Extremities: extremities normal, atraumatic, no cyanosis or edema Skin: Skin color, texture, turgor normal. No rashes or lesions Lymph nodes: Cervical, supraclavicular, and axillary nodes normal. No abnormal inguinal nodes palpated Neurologic: Grossly normal   Pelvic: External genitalia:  no lesions              Urethra:  normal appearing urethra with no masses, tenderness or lesions              Bartholins and Skenes: normal                 Vagina: normal appearing vagina with normal color and discharge, no lesions              Cervix: {CHL AMB PHY EX CERVIX NORM [phone]::"no lesions"}               Bimanual Exam:  Uterus:  {CHL AMB PHY EX UTERUS NORM DEFAULT:[phone]::"normal size, contour, position, consistency, mobility, non-tender"}              Adnexa: {CHL AMB PHY EX ADNEXA NO MASS [phone]::"no mass, fullness, tenderness"}               Rectovaginal: Confirms  Anus:  normal sphincter tone, no lesions  *** chaperoned for the exam.  A:  Well Woman with normal exam  P:

## 2021-04-20 ENCOUNTER — Ambulatory Visit: Payer: BC Managed Care – PPO | Admitting: Obstetrics and Gynecology

## 2021-04-20 NOTE — Progress Notes (Signed)
30 y.o. G0P0000 Single Black or African American Not Hispanic or Latino female here for annual exam.  Doing well on OCP's. Period Cycle (Days): 28 Period Duration (Days): 7 Period Pattern: Regular Menstrual Flow: Heavy Menstrual Control: Tampon, Maxi pad, Thin pad (super tampon) Menstrual Control Change Freq (Hours): 1.5 Dysmenorrhea: (!) Severe Dysmenorrhea Symptoms: Cramping, Nausea, Diarrhea Cramps are between moderated to severe, helped with OTC medication.  Not sexually active.  She currently has a few boils in the groin area, not always present.   Patient's last menstrual period was 04/06/2021.          Sexually active: No.  The current method of family planning is OCP (estrogen/progesterone).    Exercising: Yes.     Cardio Smoker:  no  Health Maintenance: Pap:  04/10/19 ASCUS HPV neg  History of abnormal Pap:  yes MMG:  None BMD:   None  Colonoscopy:none  TDaP:  03/21/19  Gardasil: complete per patient    reports that she has never smoked. She has never used smokeless tobacco. She reports current alcohol use. She reports that she does not use drugs.Rare ETOH. 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   Past Medical History:  Diagnosis Date   Dysmenorrhea    Migraine without aura     Past Surgical History:  Procedure Laterality Date   CYST REMOVAL HAND     WISDOM TOOTH EXTRACTION      Current Outpatient Medications  Medication Sig Dispense Refill   cetirizine (ZYRTEC) 10 MG tablet Take 2 tablets by mouth daily.     Multiple Vitamin (MULTIVITAMIN) capsule Take 1 capsule by mouth daily.     norethindrone-ethinyl estradiol (LOESTRIN FE) 1-20 MG-MCG tablet Take 1 tablet by mouth daily. 84 tablet 1   PREVIDENT 5000 ENAMEL PROTECT 1.1-5 % PSTE USE 3-4 TIMES A WEEK AT NIGHT BEFORE BEDTIME     Probiotic Product (PROBIOTIC PO) Take by mouth.     No current facility-administered medications for this visit.    Family History  Problem Relation Age of Onset    Breast cancer Paternal Aunt     Review of Systems  Psychiatric/Behavioral:  The patient is nervous/anxious.   All other systems reviewed and are negative.  Exam:   BP 110/70   Pulse 71   Ht 5' 0.5" (1.537 m)   Wt 163 lb 12.8 oz (74.3 kg)   LMP 04/06/2021   SpO2 100%   BMI 31.46 kg/m   Weight change: @WEIGHTCHANGE @ Height:   Height: 5' 0.5" (153.7 cm)  Ht Readings from Last 3 Encounters:  04/29/21 5' 0.5" (1.537 m)  01/17/21 5' (1.524 m)  06/29/20 5' (1.524 m)    General appearance: alert, cooperative and appears stated age Head: Normocephalic, without obvious abnormality, atraumatic Neck: no adenopathy, supple, symmetrical, trachea midline and thyroid normal to inspection and palpation Lungs: clear to auscultation bilaterally Cardiovascular: regular rate and rhythm Breasts: normal appearance, no masses or tenderness Abdomen: soft, non-tender; non distended,  no masses,  no organomegaly Extremities: extremities normal, atraumatic, no cyanosis or edema Skin: Skin color, texture, turgor normal. No rashes or lesions Lymph nodes: Cervical, supraclavicular, and axillary nodes normal. No abnormal inguinal nodes palpated Neurologic: Grossly normal   Pelvic: External genitalia:  no lesions              Urethra:  normal appearing urethra with no masses, tenderness or lesions              Bartholins and  Skenes: normal                 Vagina: normal appearing vagina with normal color and discharge, no lesions              Cervix: no lesions               Bimanual Exam:  Uterus:  normal size, contour, position, consistency, mobility, non-tender and anteverted              Adnexa: no mass, fullness, tenderness               Rectovaginal: Confirms               Anus:  normal sphincter tone, no lesions  Carolynn Serve chaperoned for the exam.  1. Well woman exam Discussed breast self exam Discussed calcium and vit D intake Pap next year  2. Screening examination for STD  (sexually transmitted disease) - RPR - HIV Antibody (routine testing w rflx) - HSV(herpes simplex vrs) 1+2 ab-IgG  3. Laboratory exam ordered as part of routine general medical examination - CBC - Comprehensive metabolic panel - Lipid panel  4. Vitamin D deficiency - VITAMIN D 25 Hydroxy (Vit-D Deficiency, Fractures)  5. Encounter for surveillance of contraceptive pills Doing well - norethindrone-ethinyl estradiol-FE (LOESTRIN FE) 1-20 MG-MCG tablet; Take 1 tablet by mouth daily.  Dispense: 84 tablet; Refill: 3

## 2021-04-29 ENCOUNTER — Ambulatory Visit (INDEPENDENT_AMBULATORY_CARE_PROVIDER_SITE_OTHER): Payer: BC Managed Care – PPO | Admitting: Obstetrics and Gynecology

## 2021-04-29 ENCOUNTER — Encounter: Payer: Self-pay | Admitting: Obstetrics and Gynecology

## 2021-04-29 ENCOUNTER — Other Ambulatory Visit: Payer: Self-pay

## 2021-04-29 VITALS — BP 110/70 | HR 71 | Ht 60.5 in | Wt 163.8 lb

## 2021-04-29 DIAGNOSIS — Z113 Encounter for screening for infections with a predominantly sexual mode of transmission: Secondary | ICD-10-CM | POA: Diagnosis not present

## 2021-04-29 DIAGNOSIS — Z01419 Encounter for gynecological examination (general) (routine) without abnormal findings: Secondary | ICD-10-CM | POA: Diagnosis not present

## 2021-04-29 DIAGNOSIS — E559 Vitamin D deficiency, unspecified: Secondary | ICD-10-CM

## 2021-04-29 DIAGNOSIS — Z0000 Encounter for general adult medical examination without abnormal findings: Secondary | ICD-10-CM

## 2021-04-29 DIAGNOSIS — Z3041 Encounter for surveillance of contraceptive pills: Secondary | ICD-10-CM

## 2021-04-29 MED ORDER — NORETHIN ACE-ETH ESTRAD-FE 1-20 MG-MCG PO TABS: 1.0000 | ORAL_TABLET | Freq: Every day | ORAL | 3 refills | Status: DC

## 2021-04-29 NOTE — Patient Instructions (Signed)

## 2021-04-29 NOTE — Addendum Note (Signed)
Addended by: Tobi Bastos on: 04/29/2021 03:40 PM   Modules accepted: Orders

## 2021-05-02 LAB — RPR: RPR Ser Ql: NONREACTIVE

## 2021-05-02 LAB — LIPID PANEL
Cholesterol: 214 mg/dL — ABNORMAL HIGH (ref <200)
HDL: 53 mg/dL (ref >=50)
LDL Cholesterol (Calc): 134 mg/dL (calc) — ABNORMAL HIGH
Non-HDL Cholesterol (Calc): 161 mg/dL (calc) — ABNORMAL HIGH (ref <130)
Total CHOL/HDL Ratio: 4 (calc) (ref <5.0)
Triglycerides: 138 mg/dL (ref <150)

## 2021-05-02 LAB — HIV ANTIBODY (ROUTINE TESTING W REFLEX): HIV 1&2 Ab, 4th Generation: NONREACTIVE

## 2021-05-02 LAB — CBC
HCT: 41.2 % (ref 35.0–45.0)
Hemoglobin: 13.2 g/dL (ref 11.7–15.5)
MCH: 28.1 pg (ref 27.0–33.0)
MCHC: 32 g/dL (ref 32.0–36.0)
MCV: 87.7 fL (ref 80.0–100.0)
MPV: 10.1 fL (ref 7.5–12.5)
Platelets: 302 10*3/uL (ref 140–400)
RBC: 4.7 10*6/uL (ref 3.80–5.10)
RDW: 11.9 % (ref 11.0–15.0)
WBC: 5.7 10*3/uL (ref 3.8–10.8)

## 2021-05-02 LAB — COMPREHENSIVE METABOLIC PANEL
AG Ratio: 1.4 (calc) (ref 1.0–2.5)
ALT: 14 U/L (ref 6–29)
AST: 16 U/L (ref 10–30)
Albumin: 4 g/dL (ref 3.6–5.1)
Alkaline phosphatase (APISO): 45 U/L (ref 31–125)
BUN: 11 mg/dL (ref 7–25)
CO2: 24 mmol/L (ref 20–32)
Calcium: 9.4 mg/dL (ref 8.6–10.2)
Chloride: 105 mmol/L (ref 98–110)
Creat: 0.85 mg/dL (ref 0.50–1.10)
Globulin: 2.8 g/dL (calc) (ref 1.9–3.7)
Glucose, Bld: 83 mg/dL (ref 65–99)
Potassium: 4.5 mmol/L (ref 3.5–5.3)
Sodium: 137 mmol/L (ref 135–146)
Total Bilirubin: 0.5 mg/dL (ref 0.2–1.2)
Total Protein: 6.8 g/dL (ref 6.1–8.1)

## 2021-05-02 LAB — SURESWAB CT/NG/T. VAGINALIS
C. trachomatis RNA, TMA: NOT DETECTED
N. gonorrhoeae RNA, TMA: NOT DETECTED
Trichomonas vaginalis RNA: NOT DETECTED

## 2021-05-02 LAB — VITAMIN D 25 HYDROXY (VIT D DEFICIENCY, FRACTURES): Vit D, 25-Hydroxy: 22 ng/mL — ABNORMAL LOW (ref 30–100)

## 2021-05-02 LAB — HSV(HERPES SIMPLEX VRS) I + II AB-IGG
HAV 1 IGG,TYPE SPECIFIC AB: <0.9 index
HSV 2 IGG,TYPE SPECIFIC AB: <0.9 index

## 2021-05-03 ENCOUNTER — Encounter: Payer: Self-pay | Admitting: Obstetrics and Gynecology

## 2021-07-02 ENCOUNTER — Other Ambulatory Visit: Payer: Self-pay | Admitting: Obstetrics and Gynecology

## 2021-07-02 DIAGNOSIS — Z3041 Encounter for surveillance of contraceptive pills: Secondary | ICD-10-CM

## 2021-07-05 ENCOUNTER — Other Ambulatory Visit: Payer: Self-pay

## 2021-07-05 NOTE — Telephone Encounter (Signed)
Left message with pharmacy that this was just prescribed on 04/29/21 for #84 with 3 refills. I asked them to call me.

## 2021-11-01 DIAGNOSIS — B349 Viral infection, unspecified: Secondary | ICD-10-CM | POA: Diagnosis not present

## 2021-12-13 IMAGING — US US BREAST*R* LIMITED INC AXILLA
1 series · 2 of 2 positions shown · non-contrast
Comparison: None.
COMPARISON: None.

Addendum:
CLINICAL DATA: 28-year-old female with left retroareolar pain for
several years which fluctuates with the patient's menstrual cycle.
No palpable abnormalities.

EXAM:
ULTRASOUND OF THE RIGHT BREAST
CLINICAL DATA: 28-year-old female with RIGHT retroareolar pain for
The remainder of the report is correct.
*** End of Addendum ***

[Series 1: us breast*right* limited inc axilla · 0.06mm/px · 2 of 2 slices shown]
[im 1/2]
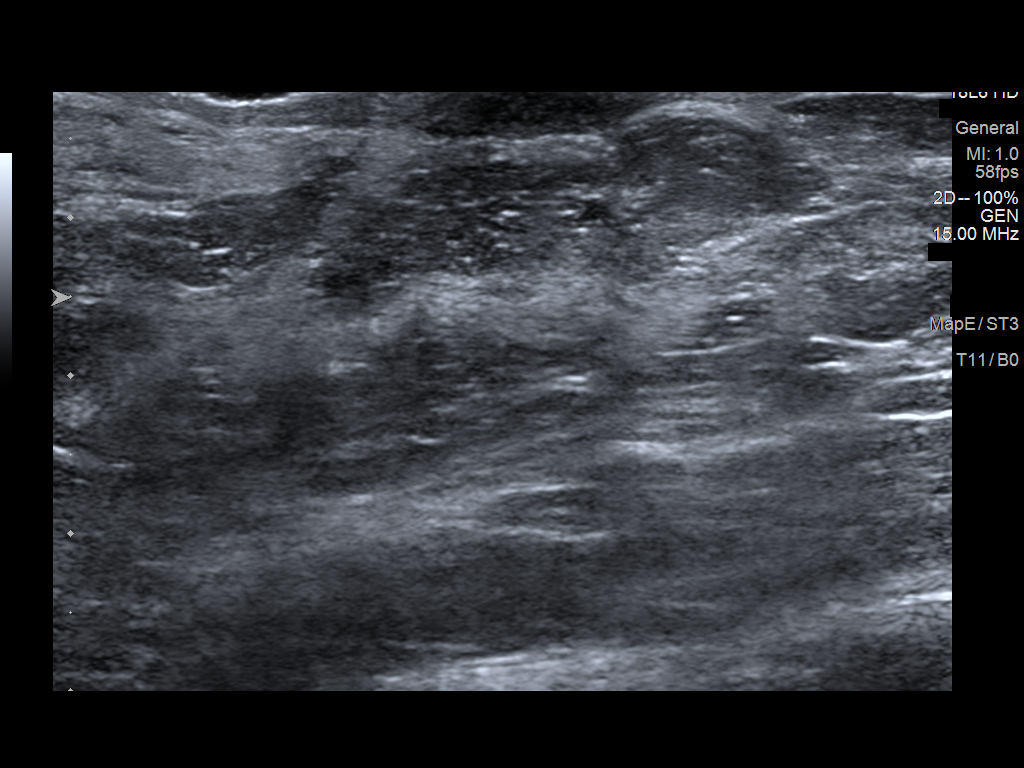
[im 2/2]
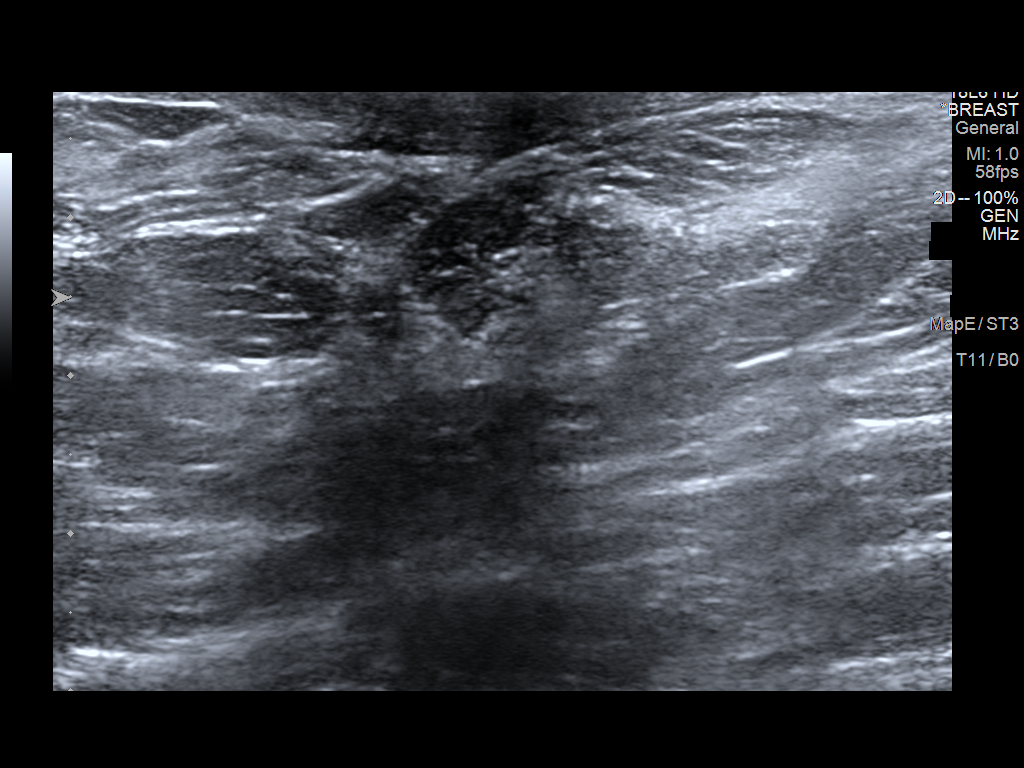

[2 of 2 positions shown; findings below may reference images not displayed]

FINDINGS: Physical examination reveals mild tenderness with palpation of the
retroareolar right breast however no underlying palpable masses.

Targeted ultrasound of the right breast was performed. No suspicious
masses or abnormality seen, only heterogeneous fibroglandular tissue
identified.
IMPRESSION: No sonographic abnormalities at the site of pain in the retroareolar
right breast.

RECOMMENDATION:
1. Recommend further management of right breast pain be based on
clinical assessment.

2. Screening mammogram at age 42 unless there are persistent or
intervening clinical concerns. (Code:W6-D-P5J)

I have discussed the findings and recommendations with the patient.
If applicable, a reminder letter will be sent to the patient
regarding the next appointment.

BI-RADS CATEGORY  1: Negative.

ADDENDUM:
An addendum is made under the clinical data portion of this exam due
to a left/right typo. This should read as:
FINDINGS: Physical examination reveals mild tenderness with palpation of the
retroareolar right breast however no underlying palpable masses.

Targeted ultrasound of the right breast was performed. No suspicious
masses or abnormality seen, only heterogeneous fibroglandular tissue
identified.
IMPRESSION: No sonographic abnormalities at the site of pain in the retroareolar
right breast.

RECOMMENDATION:
1. Recommend further management of right breast pain be based on
clinical assessment.

2. Screening mammogram at age 42 unless there are persistent or
intervening clinical concerns. (Code:W6-D-P5J)

I have discussed the findings and recommendations with the patient.
If applicable, a reminder letter will be sent to the patient
regarding the next appointment.

BI-RADS CATEGORY  1: Negative.

## 2022-02-01 ENCOUNTER — Telehealth: Payer: Self-pay | Admitting: *Deleted

## 2022-02-01 NOTE — Telephone Encounter (Signed)
-----   Message from Leron Croak, [place] sent at 02/01/2022  3:50 PM EDT ----- ?Hey ladies  ?This pt is requesting a refill on her birth control. Her aex is not due until June.. thanks  ? ?

## 2022-02-01 NOTE — Telephone Encounter (Signed)
I called pharmacy and they have a refill at the pharmacy for patient. I called patient and informed her of this as well.  ?

## 2022-04-21 NOTE — Progress Notes (Signed)
31 y.o. G0P0000 Single Black or African American Not Hispanic or Latino female here for annual exam.   She is having intermittent lower abdominal pain on the left side for a couple of months, the pain is random. Occurs ~1 x a month, lasts for a day or two, helped with tylenol. She has some constipation, BM currently every other day. Primary started her linzess, which is helping.  Period Cycle (Days): 28 Period Duration (Days): 5-7 Period Pattern: Regular Menstrual Flow: Moderate Menstrual Control: Tampon (super tampon) Menstrual Control Change Freq (Hours): 3-4 Dysmenorrhea: (!) Moderate Dysmenorrhea Symptoms: Cramping, Headache, Diarrhea, Nausea Sexually active, same partner x 3 years. No dyspareunia.   Patient's last menstrual period was 04/05/2022.          Sexually active: Yes.    The current method of family planning is OCP    Exercising: No.  The patient does not participate in regular exercise at present. Smoker:  no  Health Maintenance: Pap:   04/10/19 ASCUS HPV neg  History of abnormal Pap:  yes MMG:  n/a BMD:  n/a Colonoscopy: n/a TDaP:  04/10/2019 Gardasil: complete per patient    reports that she has never smoked. She has never used smokeless tobacco. She reports current alcohol use. She reports that she does not use drugs. Just occasional ETOH. 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   Past Medical History:  Diagnosis Date   Dysmenorrhea    Low vitamin D level    Migraine without aura     Past Surgical History:  Procedure Laterality Date   CYST REMOVAL HAND     WISDOM TOOTH EXTRACTION      Current Outpatient Medications  Medication Sig Dispense Refill   cetirizine (ZYRTEC) 10 MG tablet Take 2 tablets by mouth daily.     Multiple Vitamin (MULTIVITAMIN) capsule Take 1 capsule by mouth daily.     norethindrone-ethinyl estradiol-FE (LOESTRIN FE) 1-20 MG-MCG tablet Take 1 tablet by mouth daily. 84 tablet 3   PREVIDENT 5000 ENAMEL PROTECT 1.1-5 %  PSTE USE 3-4 TIMES A WEEK AT NIGHT BEFORE BEDTIME     Probiotic Product (PROBIOTIC PO) Take by mouth.     No current facility-administered medications for this visit.    Family History  Problem Relation Age of Onset   Breast cancer Paternal Aunt     Review of Systems  All other systems reviewed and are negative.   Exam:   BP 122/74   Pulse 72   Ht 5' (1.524 m)   Wt 163 lb (73.9 kg)   LMP 04/05/2022   SpO2 99%   BMI 31.83 kg/m   Weight change: @WEIGHTCHANGE @ Height:   Height: 5' (152.4 cm)  Ht Readings from Last 3 Encounters:  05/01/22 5' (1.524 m)  04/29/21 5' 0.5" (1.537 m)  01/17/21 5' (1.524 m)    General appearance: alert, cooperative and appears stated age Head: Normocephalic, without obvious abnormality, atraumatic Neck: no adenopathy, supple, symmetrical, trachea midline and thyroid normal to inspection and palpation Lungs: clear to auscultation bilaterally Cardiovascular: regular rate and rhythm Breasts: normal appearance, no masses or tenderness Abdomen: soft, non-tender; non distended,  no masses,  no organomegaly Extremities: extremities normal, atraumatic, no cyanosis or edema Skin: Skin color, texture, turgor normal. No rashes or lesions Lymph nodes: Cervical, supraclavicular, and axillary nodes normal. No abnormal inguinal nodes palpated Neurologic: Grossly normal   Pelvic: External genitalia:  no lesions  Urethra:  normal appearing urethra with no masses, tenderness or lesions              Bartholins and Skenes: normal                 Vagina: normal appearing vagina with normal color and discharge, no lesions              Cervix: no lesions               Bimanual Exam:  Uterus:  normal size, contour, position, consistency, mobility, non-tender and retroverted              Adnexa: no mass, fullness, tenderness               Rectovaginal: Confirms               Anus:  normal sphincter tone, no lesions  Carolynn Serve, CMA chaperoned for the  exam.  1. Well woman exam Discussed breast self exam Discussed calcium and vit D intake Labs with primary  2. Encounter for surveillance of contraceptive pills Doing well - norethindrone-ethinyl estradiol-FE (LOESTRIN FE) 1-20 MG-MCG tablet; Take 1 tablet by mouth daily.  Dispense: 84 tablet; Refill: 3  3. Screening for cervical cancer - Cytology - PAP  4. Vitamin D deficiency  On a multivit - VITAMIN D 25 Hydroxy (Vit-D Deficiency, Fractures)  5. LLQ abdominal pain Normal exam, suspect her pain is from her constipation. She will pay closer attention, has medication now for her constipation. If she continues to have pain and feels it isn't related to her constipation, we can set her up for an ultrasound.

## 2022-05-01 ENCOUNTER — Other Ambulatory Visit: Payer: Self-pay

## 2022-05-01 ENCOUNTER — Other Ambulatory Visit (HOSPITAL_COMMUNITY)
Admission: RE | Admit: 2022-05-01 | Discharge: 2022-05-01 | Disposition: A | Discharge status: Home / Self Care | Payer: BC Managed Care – PPO | Source: Ambulatory Visit | Attending: Obstetrics and Gynecology | Admitting: Obstetrics and Gynecology

## 2022-05-01 ENCOUNTER — Ambulatory Visit (INDEPENDENT_AMBULATORY_CARE_PROVIDER_SITE_OTHER): Payer: BC Managed Care – PPO | Admitting: Obstetrics and Gynecology

## 2022-05-01 VITALS — BP 122/74 | HR 72 | Ht 60.0 in | Wt 163.0 lb

## 2022-05-01 DIAGNOSIS — Z3041 Encounter for surveillance of contraceptive pills: Secondary | ICD-10-CM | POA: Diagnosis not present

## 2022-05-01 DIAGNOSIS — E559 Vitamin D deficiency, unspecified: Secondary | ICD-10-CM

## 2022-05-01 DIAGNOSIS — Z124 Encounter for screening for malignant neoplasm of cervix: Secondary | ICD-10-CM | POA: Insufficient documentation

## 2022-05-01 DIAGNOSIS — R1032 Left lower quadrant pain: Secondary | ICD-10-CM | POA: Diagnosis not present

## 2022-05-01 DIAGNOSIS — Z01419 Encounter for gynecological examination (general) (routine) without abnormal findings: Secondary | ICD-10-CM

## 2022-05-01 MED ORDER — NORETHIN ACE-ETH ESTRAD-FE 1-20 MG-MCG PO TABS: 1.0000 | ORAL_TABLET | Freq: Every day | ORAL | 3 refills | Status: DC

## 2022-05-01 NOTE — Addendum Note (Signed)
Addended by: Rushie Goltz on: 05/01/2022 11:13 AM   Modules accepted: Orders

## 2022-05-02 LAB — CYTOLOGY - PAP: Diagnosis: NEGATIVE

## 2022-11-08 ENCOUNTER — Encounter: Payer: Self-pay | Admitting: Obstetrics and Gynecology

## 2022-11-08 ENCOUNTER — Ambulatory Visit (INDEPENDENT_AMBULATORY_CARE_PROVIDER_SITE_OTHER): Payer: BC Managed Care – PPO | Admitting: Obstetrics and Gynecology

## 2022-11-08 VITALS — BP 130/70 | HR 66 | Wt 150.0 lb

## 2022-11-08 DIAGNOSIS — N760 Acute vaginitis: Secondary | ICD-10-CM

## 2022-11-08 LAB — WET PREP FOR TRICH, YEAST, CLUE

## 2022-11-08 MED ORDER — BETAMETHASONE VALERATE 0.1 % EX OINT: TOPICAL_OINTMENT | CUTANEOUS | 0 refills | Status: AC

## 2022-11-08 NOTE — Progress Notes (Signed)
GYNECOLOGY  VISIT   HPI: 31 y.o.   Single Black or African American Not Hispanic or Latino  female   G0P0000 with Patient's last menstrual period was 10/12/2022.   here for a one week h/o vaginal itching and discharge.  The d/c is a creamy white, no odor. The itching is moderate.   GYNECOLOGIC HISTORY: Patient's last menstrual period was 10/12/2022. 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     cetirizine (ZYRTEC) 10 MG tablet Take 2 tablets by mouth daily.     linaclotide (LINZESS) 145 MCG CAPS capsule Take 145 mcg by mouth daily before breakfast.     methimazole (TAPAZOLE) 10 MG tablet Take by mouth.     norethindrone-ethinyl estradiol-FE (LOESTRIN FE) 1-20 MG-MCG tablet Take 1 tablet by mouth daily. 84 tablet 3   PREVIDENT 5000 ENAMEL PROTECT 1.1-5 % PSTE USE 3-4 TIMES A WEEK AT NIGHT BEFORE BEDTIME     No current facility-administered medications for this visit.     ALLERGIES: Nsaids, Aspirin, and Ibuprofen  Family History  Problem Relation Age of Onset   Breast cancer Paternal Aunt     Social History   Socioeconomic History   Marital status: Single    Spouse name: Not on file   Number of children: Not  on file   Years of education: Not on file   Highest education level: Not on file  Occupational History   Not on file  Tobacco Use   Smoking status: Never   Smokeless tobacco: Never  Vaping Use   Vaping Use: Never used  Substance and Sexual Activity   Alcohol use: Yes    Comment: occassionally   Drug use: Never   Sexual activity: Yes    Partners: Male    Birth control/protection: None, OCP  Other Topics Concern   Not on file  Social History Narrative   Not on file   Social Determinants of Health   Financial Resource Strain: Not on file  Food Insecurity: Not on file  Transportation Needs: Not on file  Physical Activity: Not on file  Stress: Not on file  Social Connections: Not on file  Intimate Partner Violence: Not on file    Review of Systems  All other systems reviewed and are negative.   PHYSICAL EXAMINATION:    BP 130/70   Pulse 66   Wt 150  lb (68 kg)   LMP 10/12/2022   SpO2 100%   BMI 29.29 kg/m     General appearance: alert, cooperative and appears stated age   Pelvic: External genitalia:  no lesions              Urethra:  normal appearing urethra with no masses, tenderness or lesions              Bartholins and Skenes: normal                 Vagina: normal appearing vagina with a slight increase of white thin and thick vaginal d/c.               Cervix: no lesions               Chaperone was present for exam.  1. Vulvovaginitis - WET PREP FOR TRICH, YEAST, CLUE: negative - SureSwab Advanced Vaginitis, TMA - betamethasone valerate ointment (VALISONE) 0.1 %; Place a pea sized amount topically BID for up to 2 weeks as needed.  Dispense: 30 g; Refill: 0

## 2022-11-09 LAB — SURESWAB® ADVANCED VAGINITIS,TMA
CANDIDA SPECIES: NOT DETECTED
Candida glabrata: NOT DETECTED
SURESWAB(R) ADV BACTERIAL VAGINOSIS(BV),TMA: NEGATIVE
TRICHOMONAS VAGINALIS (TV),TMA: NOT DETECTED

## 2023-05-08 ENCOUNTER — Other Ambulatory Visit: Payer: Self-pay

## 2023-05-08 DIAGNOSIS — Z3041 Encounter for surveillance of contraceptive pills: Secondary | ICD-10-CM

## 2023-05-08 MED ORDER — NORETHIN ACE-ETH ESTRAD-FE 1-20 MG-MCG PO TABS: 1.0000 | ORAL_TABLET | Freq: Every day | ORAL | 0 refills | Status: AC

## 2023-05-08 NOTE — Telephone Encounter (Signed)
Medication refill request: [place]  FE Last AEX:  05/01/22 Next AEX: not scheduled  Last MMG (if hormonal medication request): n/a Refill authorized: #84 with 0 rf  pended for today. Note sent to pharmacy for patient to schedule annual exam.
